# Patient Record
Sex: Female | Born: 1985 | Race: White | Hispanic: No | Marital: Single | Smoking: Current every day smoker
Health system: Southern US, Community
[De-identification: ages and names within clinical notes are randomized; demographics above are authoritative.]

## PROBLEM LIST (undated history)

## (undated) DIAGNOSIS — F1111 Opioid abuse, in remission: Secondary | ICD-10-CM

## (undated) HISTORY — PX: TUBAL LIGATION: SHX77

---
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
## 2004-05-30 ENCOUNTER — Emergency Department (HOSPITAL_COMMUNITY): Admission: EM | Admit: 2004-05-30 | Discharge: 2004-05-30 | Payer: Self-pay | Admitting: Emergency Medicine

## 2004-06-08 ENCOUNTER — Ambulatory Visit (HOSPITAL_COMMUNITY): Admission: RE | Admit: 2004-06-08 | Discharge: 2004-06-08 | Payer: Self-pay | Admitting: Otolaryngology

## 2004-06-08 ENCOUNTER — Ambulatory Visit (HOSPITAL_BASED_OUTPATIENT_CLINIC_OR_DEPARTMENT_OTHER): Admission: RE | Admit: 2004-06-08 | Discharge: 2004-06-08 | Payer: Self-pay | Admitting: Otolaryngology

## 2004-06-09 ENCOUNTER — Ambulatory Visit (HOSPITAL_COMMUNITY): Admission: RE | Admit: 2004-06-09 | Discharge: 2004-06-09 | Payer: Self-pay | Admitting: Otolaryngology

## 2004-10-04 ENCOUNTER — Inpatient Hospital Stay (HOSPITAL_COMMUNITY): Admission: AD | Admit: 2004-10-04 | Discharge: 2004-10-04 | Payer: Self-pay | Admitting: Obstetrics & Gynecology

## 2005-04-09 ENCOUNTER — Observation Stay (HOSPITAL_COMMUNITY): Admission: AD | Admit: 2005-04-09 | Discharge: 2005-04-10 | Payer: Self-pay | Admitting: Obstetrics and Gynecology

## 2005-05-18 ENCOUNTER — Inpatient Hospital Stay (HOSPITAL_COMMUNITY): Admission: AD | Admit: 2005-05-18 | Discharge: 2005-05-18 | Payer: Self-pay | Admitting: Obstetrics and Gynecology

## 2005-06-17 ENCOUNTER — Observation Stay (HOSPITAL_COMMUNITY): Admission: AD | Admit: 2005-06-17 | Discharge: 2005-06-18 | Payer: Self-pay | Admitting: Obstetrics and Gynecology

## 2005-07-04 ENCOUNTER — Inpatient Hospital Stay (HOSPITAL_COMMUNITY): Admission: AD | Admit: 2005-07-04 | Discharge: 2005-07-04 | Payer: Self-pay | Admitting: Obstetrics and Gynecology

## 2005-07-15 ENCOUNTER — Inpatient Hospital Stay (HOSPITAL_COMMUNITY): Admission: AD | Admit: 2005-07-15 | Discharge: 2005-07-17 | Payer: Self-pay | Admitting: Obstetrics and Gynecology

## 2005-07-15 ENCOUNTER — Encounter (INDEPENDENT_AMBULATORY_CARE_PROVIDER_SITE_OTHER): Payer: Self-pay | Admitting: Specialist

## 2006-10-05 ENCOUNTER — Emergency Department (HOSPITAL_COMMUNITY): Admission: EM | Admit: 2006-10-05 | Discharge: 2006-10-05 | Payer: Self-pay | Admitting: Emergency Medicine

## 2006-12-21 ENCOUNTER — Inpatient Hospital Stay (HOSPITAL_COMMUNITY): Admission: AD | Admit: 2006-12-21 | Discharge: 2006-12-21 | Payer: Self-pay | Admitting: Obstetrics

## 2007-02-20 ENCOUNTER — Inpatient Hospital Stay (HOSPITAL_COMMUNITY): Admission: AD | Admit: 2007-02-20 | Discharge: 2007-02-22 | Payer: Self-pay | Admitting: Obstetrics

## 2008-03-20 ENCOUNTER — Emergency Department (HOSPITAL_COMMUNITY): Admission: EM | Admit: 2008-03-20 | Discharge: 2008-03-20 | Payer: Self-pay | Admitting: Emergency Medicine

## 2008-08-12 ENCOUNTER — Inpatient Hospital Stay (HOSPITAL_COMMUNITY): Admission: AD | Admit: 2008-08-12 | Discharge: 2008-08-12 | Payer: Self-pay | Admitting: Obstetrics & Gynecology

## 2008-11-14 ENCOUNTER — Ambulatory Visit (HOSPITAL_COMMUNITY): Admission: RE | Admit: 2008-11-14 | Discharge: 2008-11-14 | Payer: Self-pay | Admitting: Obstetrics

## 2009-02-08 ENCOUNTER — Inpatient Hospital Stay (HOSPITAL_COMMUNITY): Admission: AD | Admit: 2009-02-08 | Discharge: 2009-02-10 | Payer: Self-pay | Admitting: Obstetrics

## 2009-02-09 ENCOUNTER — Encounter (INDEPENDENT_AMBULATORY_CARE_PROVIDER_SITE_OTHER): Payer: Self-pay | Admitting: Obstetrics

## 2009-09-08 ENCOUNTER — Emergency Department (HOSPITAL_COMMUNITY): Admission: EM | Admit: 2009-09-08 | Discharge: 2009-09-08 | Payer: Self-pay | Admitting: Emergency Medicine

## 2009-10-20 ENCOUNTER — Emergency Department (HOSPITAL_COMMUNITY): Admission: EM | Admit: 2009-10-20 | Discharge: 2009-10-20 | Payer: Self-pay | Admitting: Emergency Medicine

## 2010-03-12 ENCOUNTER — Emergency Department (HOSPITAL_COMMUNITY)
Admission: EM | Admit: 2010-03-12 | Discharge: 2010-03-12 | Disposition: A | Discharge status: Home / Self Care | Payer: Self-pay | Attending: Emergency Medicine | Admitting: Emergency Medicine

## 2010-03-12 DIAGNOSIS — Y92009 Unspecified place in unspecified non-institutional (private) residence as the place of occurrence of the external cause: Secondary | ICD-10-CM | POA: Insufficient documentation

## 2010-03-12 DIAGNOSIS — IMO0002 Reserved for concepts with insufficient information to code with codable children: Secondary | ICD-10-CM | POA: Insufficient documentation

## 2010-03-12 DIAGNOSIS — M79609 Pain in unspecified limb: Secondary | ICD-10-CM | POA: Insufficient documentation

## 2010-03-12 DIAGNOSIS — S92919A Unspecified fracture of unspecified toe(s), initial encounter for closed fracture: Secondary | ICD-10-CM | POA: Insufficient documentation

## 2010-03-12 DIAGNOSIS — M7989 Other specified soft tissue disorders: Secondary | ICD-10-CM | POA: Insufficient documentation

## 2010-03-12 DIAGNOSIS — F17200 Nicotine dependence, unspecified, uncomplicated: Secondary | ICD-10-CM | POA: Insufficient documentation

## 2010-03-12 DIAGNOSIS — S90129A Contusion of unspecified lesser toe(s) without damage to nail, initial encounter: Secondary | ICD-10-CM | POA: Insufficient documentation

## 2010-03-24 LAB — CBC
HCT: 24.5 % — ABNORMAL LOW (ref 36.0–46.0)
HCT: 32.3 % — ABNORMAL LOW (ref 36.0–46.0)
Hemoglobin: 10.9 g/dL — ABNORMAL LOW (ref 12.0–15.0)
Hemoglobin: 8.3 g/dL — ABNORMAL LOW (ref 12.0–15.0)
MCHC: 33.6 g/dL (ref 30.0–36.0)
MCHC: 33.8 g/dL (ref 30.0–36.0)
MCV: 95 fL (ref 78.0–100.0)
MCV: 95.1 fL (ref 78.0–100.0)
Platelets: 190 10*3/uL (ref 150–400)
Platelets: 226 10*3/uL (ref 150–400)
RBC: 2.59 MIL/uL — ABNORMAL LOW (ref 3.87–5.11)
RBC: 3.4 MIL/uL — ABNORMAL LOW (ref 3.87–5.11)
RDW: 14 % (ref 11.5–15.5)
RDW: 14.1 % (ref 11.5–15.5)
WBC: 12.4 10*3/uL — ABNORMAL HIGH (ref 4.0–10.5)
WBC: 13.3 10*3/uL — ABNORMAL HIGH (ref 4.0–10.5)

## 2010-03-24 LAB — RPR: RPR Ser Ql: NONREACTIVE

## 2010-04-10 LAB — WET PREP, GENITAL
Clue Cells Wet Prep HPF POC: NONE SEEN
Trich, Wet Prep: NONE SEEN
Yeast Wet Prep HPF POC: NONE SEEN

## 2010-04-10 LAB — URINALYSIS, ROUTINE W REFLEX MICROSCOPIC
Bilirubin Urine: NEGATIVE
Glucose, UA: NEGATIVE mg/dL
Hgb urine dipstick: NEGATIVE
Ketones, ur: NEGATIVE mg/dL
Nitrite: NEGATIVE
Protein, ur: NEGATIVE mg/dL
Specific Gravity, Urine: 1.015 (ref 1.005–1.030)
Urobilinogen, UA: 0.2 mg/dL (ref 0.0–1.0)
pH: 7.5 (ref 5.0–8.0)

## 2010-04-10 LAB — GC/CHLAMYDIA PROBE AMP, GENITAL
Chlamydia, DNA Probe: NEGATIVE
GC Probe Amp, Genital: NEGATIVE

## 2010-04-15 LAB — POCT I-STAT, CHEM 8
BUN: 6 mg/dL (ref 6–23)
Calcium, Ion: 1.19 mmol/L (ref 1.12–1.32)
Chloride: 105 mEq/L (ref 96–112)
Creatinine, Ser: 0.8 mg/dL (ref 0.4–1.2)
Glucose, Bld: 83 mg/dL (ref 70–99)
HCT: 37 % (ref 36.0–46.0)
Hemoglobin: 12.6 g/dL (ref 12.0–15.0)
Potassium: 3.8 mEq/L (ref 3.5–5.1)
Sodium: 141 mEq/L (ref 135–145)
TCO2: 30 mmol/L (ref 0–100)

## 2010-04-15 LAB — URINE MICROSCOPIC-ADD ON

## 2010-04-15 LAB — URINALYSIS, ROUTINE W REFLEX MICROSCOPIC
Glucose, UA: NEGATIVE mg/dL
Ketones, ur: 15 mg/dL — AB
Nitrite: POSITIVE — AB
Protein, ur: 100 mg/dL — AB
Specific Gravity, Urine: 1.022 (ref 1.005–1.030)
Urobilinogen, UA: 2 mg/dL — ABNORMAL HIGH (ref 0.0–1.0)
pH: 5.5 (ref 5.0–8.0)

## 2010-04-15 LAB — POCT PREGNANCY, URINE: Preg Test, Ur: NEGATIVE
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
## 2010-05-21 NOTE — Discharge Summary (Signed)
Katie Glover, Katie Glover           ACCOUNT NO.:  [id number]   MEDICAL RECORD NO.:  [id number]          PATIENT TYPE:  OBV   LOCATION:  9171                          FACILITY:  [facility]   PHYSICIAN:  Gerald Leitz, MD          DATE OF BIRTH:  June 24, 1985   DATE OF ADMISSION:  04/09/2005  DATE OF DISCHARGE:  04/09/2005                                 DISCHARGE SUMMARY   DIAGNOSES:  A 26 week, 3 day intrauterine pregnancy with preterm  contractions.   DISCHARGE DIAGNOSES:  A 26 week, 3 day intrauterine pregnancy with preterm  contractions.  No evidence of preterm labor.   BRIEF HOSPITAL COURSE:  The patient was seen at [facility] on April 09, 2005, complaining of contractions every 5 minutes.  She was noted to have  contractions every 2 minutes and received two doses of 25 mcg of Terbutaline  subcutaneously, continued to contract and was sent to the antenatal unit for  observation to rule out preterm labor.  She was given one dosage of 5 mg of  Terbutaline p.o. as well as IV fluids.  Contractions ceased.  There was no  change in her cervix.  Her cervix was actually closed upon examination.  She  will be discharged home in stable condition.  Activity:  Modified bed rest  for April 10, 2005.  She will resume regular activity on April 9, if no  contractions.   DISCHARGE MEDICATIONS:  1.  Terbutaline 5 mg p.o. q.4-6 h.  2.  Ferrous sulfate 325 mg one p.o. daily.   FOLLOWUP:  She will follow up with Dr. Sydnee Cabal within the next five days.  She is to call to schedule this appointment.  She was given  precautions  prior to discharge.      Gerald Leitz, MD  Electronically Signed     TC/MEDQ  D:  04/10/2005  T:  04/11/2005  Job:  [id number]

## 2010-05-21 NOTE — H&P (Signed)
Katie Glover, Katie Glover NO.:  [id number]   MEDICAL RECORD NO.:  [id number]          PATIENT TYPE:  INP   LOCATION:  9157                          FACILITY:  [facility]   PHYSICIAN:  Kizzie Furnish, M.D.  DATE OF BIRTH:  July 05, 1985   DATE OF ADMISSION:  06/17/2005  DATE OF DISCHARGE:                                HISTORY & PHYSICAL   This is a 25 year old G3, P1-0-1-1, at 15 and 5/7 weeks estimated  gestational age based on a six weeks ultrasound with EDD of July 17, 2005,  who presented to the office today for routine prenatal visit.  She is a  patient of Dr. Loreli Dollar.  During the office visit, the patient was  noted to have a questionable fetal heart arrhythmia on Doppler.  NST was  performed and this was reactive, but there was some question as to whether  there is a heart arrhythmia.  She will be admitted for 23 hour observation.  The patient was discussed with Dr. Ander Slade with [facility] Maternal Fetal Medicine Department.  We will obtain an ultrasound to  look at the heart structures for any anatomic abnormality.  Also, proceed  with a BPP and monitor overnight.   PAST MEDICAL HISTORY:  Significant for asthma.   PAST SURGICAL HISTORY:  Negative.   PAST OB HISTORY:  Vaginal delivery at 39 weeks in 2005, no complications,  the baby weighed 6 pounds 13 ounces.   PAST GYN HISTORY:  History of Gonorrhea treated in 2006.  History of  Trichomonas with this pregnancy treated in November 2006.  Trichomonas was  also present on wet prep today.   MEDICATIONS:  Prenatal vitamins, Zoloft, iron sulfate.   ALLERGIES:  No known drug allergies.   FAMILY HISTORY:  Noncontributory.  No family history of cardiac  abnormalities or disease.   SOCIAL HISTORY:  The patient is single.  Positive tobacco use.  Denies  alcohol or illicit drug use.   REVIEW OF SYSTEMS:  Vaginal discharge and irritation, otherwise, negative.   PHYSICAL EXAMINATION:   Vital signs revealed blood pressure 116/70, weight  149 1/2 pounds, fetal heart rate baseline 130s, good beat to beat  variability with positive accelerations, questionable arrhythmia on the  tracing.  Irregular contractions approximately every 10 minutes.  No  decelerations.   IMPRESSION AND PLAN:  35 5/7 weeks intrauterine pregnancy with questionable  heart arrhythmia, will admit for 23 hour observation, continue fetal  monitoring and toco, will also request an ultrasound to evaluate heart  structures as well as a biophysical profile.  If irregularity of heart rate  continues, consider consulting [facility] Maternal Fetal Medicine  specialist and pediatric cardiology.           ______________________________  Kizzie Furnish, M.D.     RMJ/MEDQ  D:  06/17/2005  T:  06/17/2005  Job:  [id number]

## 2010-05-21 NOTE — Discharge Summary (Signed)
NAMEFAIZA, Katie Glover           ACCOUNT NO.:  [id number]   MEDICAL RECORD NO.:  [id number]          PATIENT TYPE:  INP   LOCATION:  9157                          [facility]ACILITY[facility]   PHYSICIAN:  Gerald Leitz, MD          DATE OF BIRTH:  [birth year]   DATE OF ADMISSION:  06/17/2005  DATE OF DISCHARGE:  06/18/2005                                 DISCHARGE SUMMARY   INDICATIONS FOR ADMISSION:  The patient admitted for 23 hour observation for  evaluation of fetal heart arrhythmia.   HOSPITAL COURSE:  As above. The patient was noted to have a reactive NST.  While admitted, she did have occasional arrhythmia, possible PAC's. She had  evaluation of heart structures, which appeared normal on gross examination.  BPP was 8 out of 8. Positive fetal movement. The patient was discussed with  Dr. Margot Ables with [facility]. She will be called with an  appointment by their office for a fetal echocardiogram on June 20, 2005  through June 22, 2005 at some point. She will followup with Dr. Sydnee Cabal on  June 28, 2005. She was given precautions to return if fetal movements  decrease. Given instructions on kick counts.   DISCHARGE MEDICATIONS:  She was started on Flagyl due to trichomonas. She  will continue her prenatal vitamins, terbutaline, and iron sulfate.      Gerald Leitz, MD  Electronically Signed     TC/MEDQ  D:  06/18/2005  T:  06/18/2005  Job:  [id number]

## 2010-09-24 LAB — CBC
HCT: 29.2 — ABNORMAL LOW
HCT: 33.7 — ABNORMAL LOW
Hemoglobin: 10.4 — ABNORMAL LOW
Hemoglobin: 11.6 — ABNORMAL LOW
MCHC: 34.6
MCHC: 35.5
MCV: 93.6
MCV: 95.3
Platelets: 208
Platelets: 253
RBC: 3.12 — ABNORMAL LOW
RBC: 3.53 — ABNORMAL LOW
RDW: 13
RDW: 13.6
WBC: 11 — ABNORMAL HIGH
WBC: 18.5 — ABNORMAL HIGH

## 2010-09-24 LAB — RPR: RPR Ser Ql: NONREACTIVE

## 2010-10-08 LAB — URINALYSIS, ROUTINE W REFLEX MICROSCOPIC
Bilirubin Urine: NEGATIVE
Glucose, UA: NEGATIVE
Hgb urine dipstick: NEGATIVE
Ketones, ur: NEGATIVE
Nitrite: NEGATIVE
Protein, ur: NEGATIVE
Specific Gravity, Urine: 1.01
Urobilinogen, UA: 0.2
pH: 6

## 2010-10-14 LAB — URINE MICROSCOPIC-ADD ON

## 2010-10-14 LAB — COMPREHENSIVE METABOLIC PANEL
ALT: 17
AST: 20
Albumin: 3 — ABNORMAL LOW
Alkaline Phosphatase: 58
BUN: 6
CO2: 27
Calcium: 8.9
Chloride: 105
Creatinine, Ser: 0.45
GFR calc Af Amer: >60
GFR calc non Af Amer: >60
Glucose, Bld: 72
Potassium: 3.7
Sodium: 139
Total Bilirubin: 0.3
Total Protein: 6

## 2010-10-14 LAB — URINALYSIS, ROUTINE W REFLEX MICROSCOPIC
Bilirubin Urine: NEGATIVE
Glucose, UA: NEGATIVE
Hgb urine dipstick: NEGATIVE
Ketones, ur: NEGATIVE
Nitrite: NEGATIVE
Protein, ur: NEGATIVE
Specific Gravity, Urine: 1.021
Urobilinogen, UA: 0.2
pH: 7

## 2010-10-14 LAB — CBC
HCT: 30.7 — ABNORMAL LOW
Hemoglobin: 10.6 — ABNORMAL LOW
MCHC: 34.5
MCV: 92.7
Platelets: 263
RBC: 3.31 — ABNORMAL LOW
RDW: 12.6
WBC: 8.8

## 2010-10-14 LAB — DIFFERENTIAL
Basophils Absolute: 0
Basophils Relative: 0
Eosinophils Absolute: 0.1
Eosinophils Relative: 1
Lymphocytes Relative: 24
Lymphs Abs: 2.1
Monocytes Absolute: 0.4
Monocytes Relative: 5
Neutro Abs: 6.1
Neutrophils Relative %: 69

## 2010-11-17 ENCOUNTER — Encounter: Payer: Self-pay | Admitting: Emergency Medicine

## 2010-11-17 ENCOUNTER — Other Ambulatory Visit: Payer: Self-pay

## 2010-11-17 ENCOUNTER — Emergency Department (HOSPITAL_COMMUNITY)
Admission: EM | Admit: 2010-11-17 | Discharge: 2010-11-18 | Discharge status: Against Medical Advice | Payer: Self-pay | Attending: Emergency Medicine | Admitting: Emergency Medicine

## 2010-11-17 DIAGNOSIS — M545 Low back pain, unspecified: Secondary | ICD-10-CM | POA: Insufficient documentation

## 2010-11-17 DIAGNOSIS — R079 Chest pain, unspecified: Secondary | ICD-10-CM | POA: Insufficient documentation

## 2010-11-17 NOTE — ED Notes (Signed)
Pt alert, nad, c/o low back pain, onset was several days ago, chest pain intermittently, resp even unlabored, skin pwd, denies n/v, denies changes in bowel or bladder, denies bleeding or discharge

## 2010-11-18 NOTE — ED Notes (Signed)
Pt called for room placement, pt not in lobby, Charge Rn, ALP aware

## 2010-11-18 NOTE — ED Notes (Signed)
Pt leaves post triage

## 2010-11-19 ENCOUNTER — Encounter (HOSPITAL_COMMUNITY): Admission: EM | Disposition: A | Discharge status: Home / Self Care | Payer: Self-pay | Source: Ambulatory Visit

## 2010-11-19 ENCOUNTER — Encounter (HOSPITAL_COMMUNITY): Payer: Self-pay | Admitting: Certified Registered"

## 2010-11-19 ENCOUNTER — Inpatient Hospital Stay (HOSPITAL_COMMUNITY): Payer: Self-pay | Admitting: Certified Registered"

## 2010-11-19 ENCOUNTER — Encounter (HOSPITAL_COMMUNITY): Payer: Self-pay | Admitting: Emergency Medicine

## 2010-11-19 ENCOUNTER — Other Ambulatory Visit (INDEPENDENT_AMBULATORY_CARE_PROVIDER_SITE_OTHER): Payer: Self-pay | Admitting: Surgery

## 2010-11-19 ENCOUNTER — Other Ambulatory Visit: Payer: Self-pay

## 2010-11-19 ENCOUNTER — Inpatient Hospital Stay (HOSPITAL_COMMUNITY)
Admission: EM | Admit: 2010-11-19 | Discharge: 2010-11-20 | DRG: 419 | Disposition: A | Discharge status: Home / Self Care | Payer: Self-pay | Source: Ambulatory Visit | Attending: Surgery | Admitting: Surgery

## 2010-11-19 ENCOUNTER — Emergency Department (HOSPITAL_COMMUNITY): Payer: Self-pay

## 2010-11-19 DIAGNOSIS — K810 Acute cholecystitis: Secondary | ICD-10-CM | POA: Diagnosis present

## 2010-11-19 DIAGNOSIS — K8010 Calculus of gallbladder with chronic cholecystitis without obstruction: Secondary | ICD-10-CM

## 2010-11-19 DIAGNOSIS — K8000 Calculus of gallbladder with acute cholecystitis without obstruction: Principal | ICD-10-CM | POA: Diagnosis present

## 2010-11-19 DIAGNOSIS — K819 Cholecystitis, unspecified: Secondary | ICD-10-CM

## 2010-11-19 HISTORY — PX: CHOLECYSTECTOMY: SHX55

## 2010-11-19 LAB — COMPREHENSIVE METABOLIC PANEL
ALT: 11 U/L (ref 0–35)
AST: 19 U/L (ref 0–37)
Albumin: 3.7 g/dL (ref 3.5–5.2)
Alkaline Phosphatase: 86 U/L (ref 39–117)
BUN: 10 mg/dL (ref 6–23)
CO2: 28 mEq/L (ref 19–32)
Calcium: 9.2 mg/dL (ref 8.4–10.5)
Chloride: 104 mEq/L (ref 96–112)
Creatinine, Ser: 0.58 mg/dL (ref 0.50–1.10)
GFR calc Af Amer: >90 mL/min (ref >90)
GFR calc non Af Amer: >90 mL/min (ref >90)
Glucose, Bld: 91 mg/dL (ref 70–99)
Potassium: 3.4 mEq/L — ABNORMAL LOW (ref 3.5–5.1)
Sodium: 139 mEq/L (ref 135–145)
Total Bilirubin: 0.1 mg/dL — ABNORMAL LOW (ref 0.3–1.2)
Total Protein: 7.6 g/dL (ref 6.0–8.3)

## 2010-11-19 LAB — URINALYSIS, ROUTINE W REFLEX MICROSCOPIC
Bilirubin Urine: NEGATIVE
Glucose, UA: NEGATIVE mg/dL
Hgb urine dipstick: NEGATIVE
Ketones, ur: NEGATIVE mg/dL
Nitrite: NEGATIVE
Protein, ur: NEGATIVE mg/dL
Specific Gravity, Urine: 1.025 (ref 1.005–1.030)
Urobilinogen, UA: 0.2 mg/dL (ref 0.0–1.0)
pH: 5.5 (ref 5.0–8.0)

## 2010-11-19 LAB — DIFFERENTIAL
Basophils Absolute: 0 10*3/uL (ref 0.0–0.1)
Basophils Relative: 0 % (ref 0–1)
Eosinophils Absolute: 0.2 10*3/uL (ref 0.0–0.7)
Eosinophils Relative: 2 % (ref 0–5)
Lymphocytes Relative: 19 % (ref 12–46)
Lymphs Abs: 2.2 10*3/uL (ref 0.7–4.0)
Monocytes Absolute: 0.7 10*3/uL (ref 0.1–1.0)
Monocytes Relative: 6 % (ref 3–12)
Neutro Abs: 8.3 10*3/uL — ABNORMAL HIGH (ref 1.7–7.7)
Neutrophils Relative %: 73 % (ref 43–77)

## 2010-11-19 LAB — CBC
HCT: 36.7 % (ref 36.0–46.0)
HCT: 31.5 % — ABNORMAL LOW (ref 36.0–46.0)
Hemoglobin: 12.2 g/dL (ref 12.0–15.0)
Hemoglobin: 10.5 g/dL — ABNORMAL LOW (ref 12.0–15.0)
MCH: 30.1 pg (ref 26.0–34.0)
MCH: 30 pg (ref 26.0–34.0)
MCHC: 33.2 g/dL (ref 30.0–36.0)
MCHC: 33.3 g/dL (ref 30.0–36.0)
MCV: 90.6 fL (ref 78.0–100.0)
MCV: 90 fL (ref 78.0–100.0)
Platelets: 250 10*3/uL (ref 150–400)
Platelets: 212 10*3/uL (ref 150–400)
RBC: 4.05 MIL/uL (ref 3.87–5.11)
RBC: 3.5 MIL/uL — ABNORMAL LOW (ref 3.87–5.11)
RDW: 12.8 % (ref 11.5–15.5)
RDW: 12.9 % (ref 11.5–15.5)
WBC: 11.5 10*3/uL — ABNORMAL HIGH (ref 4.0–10.5)
WBC: 8.2 10*3/uL (ref 4.0–10.5)

## 2010-11-19 LAB — LIPASE, BLOOD: Lipase: 17 U/L (ref 11–59)

## 2010-11-19 LAB — URINE MICROSCOPIC-ADD ON

## 2010-11-19 LAB — CREATININE, SERUM
Creatinine, Ser: 0.55 mg/dL (ref 0.50–1.10)
GFR calc Af Amer: >90 mL/min (ref >90)
GFR calc non Af Amer: >90 mL/min (ref >90)

## 2010-11-19 LAB — PREGNANCY, URINE: Preg Test, Ur: NEGATIVE

## 2010-11-19 SURGERY — LAPAROSCOPIC CHOLECYSTECTOMY
Anesthesia: General | Site: Abdomen | Wound class: Clean Contaminated

## 2010-11-19 MED ORDER — ACETAMINOPHEN 650 MG RE SUPP: 650.0000 mg | Freq: Four times a day (QID) | RECTAL | Status: DC | PRN

## 2010-11-19 MED ORDER — GLYCOPYRROLATE 0.2 MG/ML IJ SOLN
INTRAMUSCULAR | Status: DC | PRN
  Administered 2010-11-19: .4 mg via INTRAVENOUS

## 2010-11-19 MED ORDER — BUPIVACAINE-EPINEPHRINE 0.25% -1:200000 IJ SOLN
INTRAMUSCULAR | Status: DC | PRN
  Administered 2010-11-19: 20 mL

## 2010-11-19 MED ORDER — ROCURONIUM BROMIDE 100 MG/10ML IV SOLN
INTRAVENOUS | Status: DC | PRN
  Administered 2010-11-19: 30 mg via INTRAVENOUS

## 2010-11-19 MED ORDER — PIPERACILLIN-TAZOBACTAM 3.375 G IVPB
3.3750 g | Freq: Three times a day (TID) | INTRAVENOUS | Status: DC
  Administered 2010-11-19 – 2010-11-20 (×2): 3.375 g via INTRAVENOUS
  Filled 2010-11-19 (×3): qty 50

## 2010-11-19 MED ORDER — KETOROLAC TROMETHAMINE 30 MG/ML IJ SOLN
INTRAMUSCULAR | Status: DC | PRN
  Administered 2010-11-19: 30 mg via INTRAVENOUS

## 2010-11-19 MED ORDER — HYDROMORPHONE HCL PF 1 MG/ML IJ SOLN
0.2500 mg | INTRAMUSCULAR | Status: DC | PRN
  Administered 2010-11-19 (×2): 0.25 mg via INTRAVENOUS
  Administered 2010-11-19: 0.5 mg via INTRAVENOUS

## 2010-11-19 MED ORDER — MIDAZOLAM HCL 5 MG/5ML IJ SOLN
INTRAMUSCULAR | Status: DC | PRN
  Administered 2010-11-19: 2 mg via INTRAVENOUS

## 2010-11-19 MED ORDER — ACETAMINOPHEN 325 MG PO TABS: 650.0000 mg | ORAL_TABLET | Freq: Four times a day (QID) | ORAL | Status: DC | PRN

## 2010-11-19 MED ORDER — FENTANYL CITRATE 0.05 MG/ML IJ SOLN
INTRAMUSCULAR | Status: DC | PRN
  Administered 2010-11-19 (×2): 50 ug via INTRAVENOUS
  Administered 2010-11-19: 100 ug via INTRAVENOUS
  Administered 2010-11-19: 50 ug via INTRAVENOUS

## 2010-11-19 MED ORDER — HYDROMORPHONE HCL PF 1 MG/ML IJ SOLN: 2.0000 mg | INTRAMUSCULAR | Status: DC | PRN

## 2010-11-19 MED ORDER — LACTATED RINGERS IV SOLN: INTRAVENOUS | Status: DC

## 2010-11-19 MED ORDER — PIPERACILLIN-TAZOBACTAM 3.375 G IVPB
3.3750 g | Freq: Three times a day (TID) | INTRAVENOUS | Status: DC
  Administered 2010-11-19: 3.375 g via INTRAVENOUS
  Filled 2010-11-19 (×3): qty 50

## 2010-11-19 MED ORDER — MORPHINE SULFATE 4 MG/ML IJ SOLN
4.0000 mg | Freq: Once | INTRAMUSCULAR | Status: AC
  Administered 2010-11-19: 4 mg via INTRAVENOUS
  Filled 2010-11-19: qty 1

## 2010-11-19 MED ORDER — MIDAZOLAM HCL 2 MG/2ML IJ SOLN: 0.5000 mg | Freq: Once | INTRAMUSCULAR | Status: AC | PRN

## 2010-11-19 MED ORDER — HYDROMORPHONE HCL PF 1 MG/ML IJ SOLN
1.0000 mg | INTRAMUSCULAR | Status: DC | PRN
  Administered 2010-11-19: 1 mg via INTRAVENOUS
  Administered 2010-11-19: 2 mg via INTRAVENOUS
  Administered 2010-11-19: 1 mg via INTRAVENOUS
  Administered 2010-11-20: 2 mg via INTRAVENOUS
  Filled 2010-11-19 (×2): qty 1
  Filled 2010-11-19 (×2): qty 2

## 2010-11-19 MED ORDER — SODIUM CHLORIDE 0.9 % IR SOLN
Status: DC | PRN
  Administered 2010-11-19: 1000 mL

## 2010-11-19 MED ORDER — HYDROMORPHONE HCL PF 1 MG/ML IJ SOLN
1.0000 mg | Freq: Once | INTRAMUSCULAR | Status: AC
  Administered 2010-11-19: 1 mg via INTRAVENOUS
  Filled 2010-11-19: qty 1

## 2010-11-19 MED ORDER — HYDROCODONE-ACETAMINOPHEN 5-325 MG PO TABS
1.0000 | ORAL_TABLET | ORAL | Status: DC | PRN
  Administered 2010-11-20: 2 via ORAL
  Filled 2010-11-19: qty 2

## 2010-11-19 MED ORDER — PANTOPRAZOLE SODIUM 40 MG IV SOLR
40.0000 mg | Freq: Every day | INTRAVENOUS | Status: DC
  Administered 2010-11-19: 40 mg via INTRAVENOUS
  Filled 2010-11-19 (×2): qty 40

## 2010-11-19 MED ORDER — ONDANSETRON HCL 4 MG PO TABS: 4.0000 mg | ORAL_TABLET | Freq: Four times a day (QID) | ORAL | Status: DC | PRN

## 2010-11-19 MED ORDER — ACETAMINOPHEN 325 MG PO TABS: 650.0000 mg | ORAL_TABLET | ORAL | Status: DC | PRN

## 2010-11-19 MED ORDER — PROPOFOL 10 MG/ML IV EMUL
INTRAVENOUS | Status: DC | PRN
  Administered 2010-11-19: 150 mg via INTRAVENOUS

## 2010-11-19 MED ORDER — ONDANSETRON HCL 4 MG/2ML IJ SOLN: 4.0000 mg | Freq: Four times a day (QID) | INTRAMUSCULAR | Status: DC | PRN

## 2010-11-19 MED ORDER — NEOSTIGMINE METHYLSULFATE 1 MG/ML IJ SOLN
INTRAMUSCULAR | Status: DC | PRN
  Administered 2010-11-19: 3 mg via INTRAVENOUS

## 2010-11-19 MED ORDER — ENOXAPARIN SODIUM 40 MG/0.4ML ~~LOC~~ SOLN
40.0000 mg | SUBCUTANEOUS | Status: DC
  Filled 2010-11-19: qty 0.4

## 2010-11-19 MED ORDER — ONDANSETRON HCL 4 MG/2ML IJ SOLN
4.0000 mg | Freq: Once | INTRAMUSCULAR | Status: AC
  Administered 2010-11-19: 4 mg via INTRAVENOUS
  Filled 2010-11-19: qty 2

## 2010-11-19 MED ORDER — KCL IN DEXTROSE-NACL 20-5-0.9 MEQ/L-%-% IV SOLN
INTRAVENOUS | Status: DC
  Administered 2010-11-20: 02:00:00 via INTRAVENOUS
  Filled 2010-11-19 (×2): qty 1000

## 2010-11-19 MED ORDER — MORPHINE SULFATE 4 MG/ML IJ SOLN: 4.0000 mg | INTRAMUSCULAR | Status: DC | PRN

## 2010-11-19 MED ORDER — KCL IN DEXTROSE-NACL 20-5-0.45 MEQ/L-%-% IV SOLN
INTRAVENOUS | Status: DC
  Administered 2010-11-19: 13:00:00 via INTRAVENOUS
  Filled 2010-11-19 (×5): qty 1000

## 2010-11-19 MED ORDER — LACTATED RINGERS IV SOLN
INTRAVENOUS | Status: DC | PRN
  Administered 2010-11-19 (×2): via INTRAVENOUS

## 2010-11-19 MED ORDER — PROMETHAZINE HCL 25 MG/ML IJ SOLN: 6.2500 mg | INTRAMUSCULAR | Status: DC | PRN

## 2010-11-19 MED ORDER — ONDANSETRON HCL 4 MG/2ML IJ SOLN
INTRAMUSCULAR | Status: DC | PRN
  Administered 2010-11-19: 4 mg via INTRAVENOUS

## 2010-11-19 MED ORDER — KETOROLAC TROMETHAMINE 30 MG/ML IJ SOLN
30.0000 mg | Freq: Four times a day (QID) | INTRAMUSCULAR | Status: DC
  Administered 2010-11-19 – 2010-11-20 (×2): 30 mg via INTRAVENOUS
  Filled 2010-11-19 (×6): qty 1

## 2010-11-19 MED ORDER — MEPERIDINE HCL 25 MG/ML IJ SOLN: 6.2500 mg | INTRAMUSCULAR | Status: DC | PRN

## 2010-11-19 MED ORDER — SODIUM CHLORIDE 0.9 % IV BOLUS (SEPSIS)
1000.0000 mL | Freq: Once | INTRAVENOUS | Status: AC
  Administered 2010-11-19: 1000 mL via INTRAVENOUS

## 2010-11-19 SURGICAL SUPPLY — 38 items
APL SKNCLS STERI-STRIP NONHPOA (GAUZE/BANDAGES/DRESSINGS) ×1
APPLIER CLIP 5 13 M/L LIGAMAX5 (MISCELLANEOUS) ×2
APR CLP MED LRG 5 ANG JAW (MISCELLANEOUS) ×1
BAG SPEC RTRVL LRG 6X4 10 (ENDOMECHANICALS) ×1
BANDAGE ADHESIVE 1X3 (GAUZE/BANDAGES/DRESSINGS) ×5 IMPLANT
BENZOIN TINCTURE PRP APPL 2/3 (GAUZE/BANDAGES/DRESSINGS) ×2 IMPLANT
CANISTER SUCTION 2500CC (MISCELLANEOUS) ×2 IMPLANT
CHLORAPREP W/TINT 26ML (MISCELLANEOUS) ×2 IMPLANT
CLIP APPLIE 5 13 M/L LIGAMAX5 (MISCELLANEOUS) ×1 IMPLANT
CLOSURE STERI STRIP 1/2 X4 (GAUZE/BANDAGES/DRESSINGS) ×1 IMPLANT
CLOTH BEACON ORANGE TIMEOUT ST (SAFETY) ×2 IMPLANT
COVER MAYO STAND STRL (DRAPES) IMPLANT
COVER SURGICAL LIGHT HANDLE (MISCELLANEOUS) ×2 IMPLANT
DECANTER SPIKE VIAL GLASS SM (MISCELLANEOUS) ×1 IMPLANT
DRAPE C-ARM 42X72 X-RAY (DRAPES) IMPLANT
ELECT REM PT RETURN 9FT ADLT (ELECTROSURGICAL) ×2
ELECTRODE REM PT RTRN 9FT ADLT (ELECTROSURGICAL) ×1 IMPLANT
GLOVE SURG SIGNA 7.5 PF LTX (GLOVE) ×2 IMPLANT
GOWN PREVENTION PLUS XLARGE (GOWN DISPOSABLE) ×2 IMPLANT
GOWN STRL NON-REIN LRG LVL3 (GOWN DISPOSABLE) ×6 IMPLANT
KIT BASIN OR (CUSTOM PROCEDURE TRAY) ×2 IMPLANT
KIT ROOM TURNOVER OR (KITS) ×2 IMPLANT
NS IRRIG 1000ML POUR BTL (IV SOLUTION) ×2 IMPLANT
PAD ARMBOARD 7.5X6 YLW CONV (MISCELLANEOUS) ×2 IMPLANT
POUCH SPECIMEN RETRIEVAL 10MM (ENDOMECHANICALS) ×1 IMPLANT
SCISSORS LAP 5X35 DISP (ENDOMECHANICALS) IMPLANT
SET CHOLANGIOGRAPH 5 50 .035 (SET/KITS/TRAYS/PACK) IMPLANT
SET IRRIG TUBING LAPAROSCOPIC (IRRIGATION / IRRIGATOR) ×2 IMPLANT
SLEEVE ENDOPATH XCEL 5M (ENDOMECHANICALS) ×4 IMPLANT
SPECIMEN JAR SMALL (MISCELLANEOUS) ×2 IMPLANT
SUT MON AB 4-0 PC3 18 (SUTURE) ×2 IMPLANT
SUT VICRYL 0 UR6 27IN ABS (SUTURE) ×1 IMPLANT
TOWEL OR 17X24 6PK STRL BLUE (TOWEL DISPOSABLE) ×2 IMPLANT
TOWEL OR 17X26 10 PK STRL BLUE (TOWEL DISPOSABLE) ×2 IMPLANT
TRAY LAPAROSCOPIC (CUSTOM PROCEDURE TRAY) ×2 IMPLANT
TROCAR XCEL BLUNT TIP 100MML (ENDOMECHANICALS) ×2 IMPLANT
TROCAR XCEL NON-BLD 5MMX100MML (ENDOMECHANICALS) ×2 IMPLANT
WATER STERILE IRR 1000ML POUR (IV SOLUTION) IMPLANT

## 2010-11-19 NOTE — Op Note (Signed)
Laparoscopic Cholecystectomy Procedure Note  Indications: This patient presents with symptomatic gallbladder disease and will undergo laparoscopic cholecystectomy.  Pre-operative Diagnosis: acute cholecystitis with cholelithiasis  Post-operative Diagnosis: Same  Surgeon: Abigail Miyamoto A   Assistants: 0  Anesthesia: General endotracheal anesthesia  ASA Class: 1  Procedure Details  The patient was seen again in the Holding Room. The risks, benefits, complications, treatment options, and expected outcomes were discussed with the patient. The possibilities of reaction to medication, pulmonary aspiration, perforation of viscus, bleeding, recurrent infection, finding a normal gallbladder, the need for additional procedures, failure to diagnose a condition, the possible need to convert to an open procedure, and creating a complication requiring transfusion or operation were discussed with the patient. The likelihood of improving the patient's symptoms with return to their baseline status is good.  The patient and/or family concurred with the proposed plan, giving informed consent. The site of surgery properly noted. The patient was taken to Operating Room, identified as Katie Glover and the procedure verified as Laparoscopic Cholecystectomy with Intraoperative Cholangiogram. A Time Out was held and the above information confirmed.  Prior to the induction of general anesthesia, antibiotic prophylaxis was administered. General endotracheal anesthesia was then administered and tolerated well. After the induction, the abdomen was prepped with Chloraprep and draped in sterile fashion. The patient was positioned in the supine position.  Local anesthetic agent was injected into the skin near the umbilicus and an incision made. We dissected down to the abdominal fascia with blunt dissection.  The fascia was incised vertically and we entered the peritoneal cavity bluntly.  A pursestring suture of  0-Vicryl was placed around the fascial opening.  The Hasson cannula was inserted and secured with the stay suture.  Pneumoperitoneum was then created with CO2 and tolerated well without any adverse changes in the patient's vital signs. An 11-mm port was placed in the subxiphoid position.  Two 5-mm ports were placed in the right upper quadrant. All skin incisions were infiltrated with a local anesthetic agent before making the incision and placing the trocars.   We positioned the patient in reverse Trendelenburg, tilted slightly to the patient's left.  The gallbladder was identified, the fundus grasped and retracted cephalad. Adhesions were lysed bluntly and with the electrocautery where indicated, taking care not to injure any adjacent organs or viscus. The infundibulum was grasped and retracted laterally, exposing the peritoneum overlying the triangle of Calot. This was then divided and exposed in a blunt fashion. The cystic duct was clearly identified and bluntly dissected circumferentially. A critical view of the cystic duct and cystic artery was obtained.  The cystic duct was then ligated with clips and divided. The cystic artery was, dissected free, ligated with clips and divided as well.   The gallbladder was dissected from the liver bed in retrograde fashion with the electrocautery. The gallbladder was removed and placed in an Endocatch sac. The liver bed was irrigated and inspected. Hemostasis was achieved with the electrocautery. Copious irrigation was utilized and was repeatedly aspirated until clear.  The gallbladder and Endocatch sac were then removed through the umbilical port site.  The pursestring suture was used to close the umbilical fascia.    We again inspected the right upper quadrant for hemostasis.  Pneumoperitoneum was released as we removed the trocars.  4-0 Monocryl was used to close the skin.   Benzoin, steri-strips, and clean dressings were applied. The patient was then extubated and  brought to the recovery room in stable condition. Instrument, sponge,  and needle counts were correct at closure and at the conclusion of the case.   Findings: Cholecystitis with Cholelithiasis  Estimated Blood Loss: Minimal         Drains: 0         Specimens: Gallbladder           Complications: None; patient tolerated the procedure well.         Disposition: PACU - hemodynamically stable.         Condition: stable    2

## 2010-11-19 NOTE — H&P (Signed)
I have seen and examined the patient and agree with the assessment and plans  

## 2010-11-19 NOTE — Preoperative (Signed)
Beta Blockers   Reason not to administer Beta Blockers:Pt does not take beta blockers

## 2010-11-19 NOTE — H&P (Signed)
Chief Complaint: Abdominal pain HPI: Katie Glover is an 25 y.o. female with about 4 day c/o of RUQ abd pain. She has never had this kind of [ain before. No known hx of gallstones. No other GI hx. Appetite has been ok despite some nausea. No fever. No CP, SOB, dysuria. BMs normal. Came to ER and found to have gallstones and wall thickening. LBs ok, Surgery consult called.  Past Medical History: History reviewed. No pertinent past medical history except 4 pregnancies, all vaginal delivery.  Past Surgical History:  Past Surgical History  Procedure Date  . Tubal ligation     Family History: History reviewed. No pertinent family history.  Social History:  reports that she has been smoking, 1ppd  She has never used smokeless tobacco. She reports that she uses illicit drugs (Marijuana). She reports that she does not drink alcohol.  Allergies: No Known Allergies  Medications: Medications Prior to Admission  Medication Dose Route Frequency Provider Last Rate Last Dose  . morphine 4 MG/ML injection 4 mg  4 mg Intravenous Once [employer], PA   4 mg at 11/19/10 1610  . morphine 4 MG/ML injection 4 mg  4 mg Intravenous Once [employer], PA   4 mg at 11/19/10 0754  . ondansetron (ZOFRAN) injection 4 mg  4 mg Intravenous Once Demetrius Charity, PA   4 mg at 11/19/10 9604  . sodium chloride 0.9 % bolus 1,000 mL  1,000 mL Intravenous Once Demetrius Charity, PA   1,000 mL at 11/19/10 0642  . DISCONTD: morphine 4 MG/ML injection 4 mg  4 mg Intravenous Q1H PRN Demetrius Charity, PA       No current outpatient prescriptions on file as of 11/19/2010.    See HPI for pertinent positives, otherwise 12 system negative.  Blood pressure 121/76, pulse 72, temperature 97.3 F (36.3 C), temperature source Oral, resp. rate 14, last menstrual period 11/03/2010, SpO2 100.00%. There is no height or weight on file to calculate BMI.  General Appearance:  Alert, cooperative, no distress, appears  stated age  Head:  Normocephalic, without obvious abnormality, atraumatic  ENT: Unremarkable  Neck: Supple, symmetrical, trachea midline, no adenopathy, thyroid: not enlarged, symmetric, no tenderness/mass/nodules  Lungs:   Clear to auscultation bilaterally, no w/r/r, respirations unlabored without use of accessory muscles.  Chest Wall:  No tenderness or deformity  Heart:  Regular rate and rhythm, S1, S2 normal, no murmur, rub or gallop. Carotids 2+ without bruit.  Abdomen:   Soft, non-tender, non distended. Tender RUQ, no masses  Genitalia:  Normal. No hernias  Rectal:  Deferred.  Extremities: Extremities normal, atraumatic, no cyanosis or edema  Pulses: 2+ and symmetric  Skin: Skin color, texture, turgor normal, no rashes or lesions  Neurologic: Normal affect, no gross deficits.   Results for orders placed during the hospital encounter of 11/19/10 (from the past 48 hour(s))  URINALYSIS, ROUTINE W REFLEX MICROSCOPIC     Status: Abnormal   Collection Time   11/19/10  6:35 AM      Component Value Range Comment   Color, Urine YELLOW  YELLOW     Appearance CLOUDY (*) CLEAR     Specific Gravity, Urine 1.025  1.005 - 1.030     pH 5.5  5.0 - 8.0     Glucose, UA NEGATIVE  NEGATIVE (mg/dL)    Hgb urine dipstick NEGATIVE  NEGATIVE     Bilirubin Urine NEGATIVE  NEGATIVE     Ketones, ur  NEGATIVE  NEGATIVE (mg/dL)    Protein, ur NEGATIVE  NEGATIVE (mg/dL)    Urobilinogen, UA 0.2  0.0 - 1.0 (mg/dL)    Nitrite NEGATIVE  NEGATIVE     Leukocytes, UA TRACE (*) NEGATIVE    PREGNANCY, URINE     Status: Normal   Collection Time   11/19/10  6:35 AM      Component Value Range Comment   Preg Test, Ur NEGATIVE     URINE MICROSCOPIC-ADD ON     Status: Abnormal   Collection Time   11/19/10  6:35 AM      Component Value Range Comment   Squamous Epithelial / LPF FEW (*) RARE     WBC, UA 0-2  <3 (WBC/hpf)    RBC / HPF 11-20  <3 (RBC/hpf)    Bacteria, UA FEW (*) RARE    CBC     Status: Abnormal    Collection Time   11/19/10  6:45 AM      Component Value Range Comment   WBC 11.5 (*) 4.0 - 10.5 (K/uL)    RBC 4.05  3.87 - 5.11 (MIL/uL)    Hemoglobin 12.2  12.0 - 15.0 (g/dL)    HCT 16.1  09.6 - 04.5 (%)    MCV 90.6  78.0 - 100.0 (fL)    MCH 30.1  26.0 - 34.0 (pg)    MCHC 33.2  30.0 - 36.0 (g/dL)    RDW 40.9  81.1 - 91.4 (%)    Platelets 250  150 - 400 (K/uL)   DIFFERENTIAL     Status: Abnormal   Collection Time   11/19/10  6:45 AM      Component Value Range Comment   Neutrophils Relative 73  43 - 77 (%)    Neutro Abs 8.3 (*) 1.7 - 7.7 (K/uL)    Lymphocytes Relative 19  12 - 46 (%)    Lymphs Abs 2.2  0.7 - 4.0 (K/uL)    Monocytes Relative 6  3 - 12 (%)    Monocytes Absolute 0.7  0.1 - 1.0 (K/uL)    Eosinophils Relative 2  0 - 5 (%)    Eosinophils Absolute 0.2  0.0 - 0.7 (K/uL)    Basophils Relative 0  0 - 1 (%)    Basophils Absolute 0.0  0.0 - 0.1 (K/uL)   COMPREHENSIVE METABOLIC PANEL     Status: Abnormal   Collection Time   11/19/10  6:45 AM      Component Value Range Comment   Sodium 139  135 - 145 (mEq/L)    Potassium 3.4 (*) 3.5 - 5.1 (mEq/L)    Chloride 104  96 - 112 (mEq/L)    CO2 28  19 - 32 (mEq/L)    Glucose, Bld 91  70 - 99 (mg/dL)    BUN 10  6 - 23 (mg/dL)    Creatinine, Ser 7.82  0.50 - 1.10 (mg/dL)    Calcium 9.2  8.4 - 10.5 (mg/dL)    Total Protein 7.6  6.0 - 8.3 (g/dL)    Albumin 3.7  3.5 - 5.2 (g/dL)    AST 19  0 - 37 (U/L)    ALT 11  0 - 35 (U/L)    Alkaline Phosphatase 86  39 - 117 (U/L)    Total Bilirubin 0.1 (*) 0.3 - 1.2 (mg/dL)    GFR calc non Af Amer >90  >90 (mL/min)    GFR calc Af Amer >90  >90 (mL/min)  LIPASE, BLOOD     Status: Normal   Collection Time   11/19/10  6:45 AM      Component Value Range Comment   Lipase 17  11 - 59 (U/L)    US Abdomen Complete  11/19/2010  *RADIOLOGY REPORT*  Clinical Data:  Right upper quadrant pain.  Nausea.  COMPLETE ABDOMINAL ULTRASOUND  Comparison:  CT abdomen and pelvis 03/20/2008.  Findings:   Gallbladder:  The gallbladder is filled with stones with wall a echo shadow sign present. Stones measure up to 1.3 cm in diameter. A small amount of free fluid is present in the right upper quadrant of the abdomen.  Gallbladder wall is thickened at 0.8 cm. Sonographer reports negative Murphy's sign.  Common bile duct:  Measures 0.3 cm.  Liver:  No focal lesion identified.  Within normal limits in parenchymal echogenicity.  IVC:  Appears normal.  Pancreas:  No focal abnormality seen.  Spleen:  Measures 5.9 cm and appears normal.  Right Kidney:  Measures 11.0 cm and appears normal.  Left Kidney:  Measures measures 11.4 cm.  Simple 1.4 cm cyst off the upper pole is noted.  Abdominal aorta:  No aneurysm identified.  IMPRESSION:  1.Multiple gallstones with gallbladder wall thickening and a small amount of right upper quadrant fluid compatible with cholecystitis. 2.  Small cyst left kidney.  Original Report Authenticated By: Bernadene Bell. Maricela Curet, M.D.    Assessment/Plan Principal Problem:  *Acute cholecystitis Admit for IV abx, IVF Discussed with pt need for cholecystectomy. Will try to get done. I discussed the procedure in detail.  The patient was given Agricultural engineer.  We discussed the risks and benefits of a laparoscopic cholecystectomy and possible cholangiogram including, but not limited to bleeding, infection, injury to surrounding structures such as the intestine or liver, bile leak, retained gallstones, need to convert to an open procedure, prolonged diarrhea, blood clots such as  DVT, common bile duct injury, anesthesia risks, and possible need for additional procedures.  The likelihood of improvement in symptoms and return to the patient's normal status is good. We discussed the typical post-operative recovery course.  Karrissa Parchment F 11/19/2010, 10:09 AM

## 2010-11-19 NOTE — Anesthesia Procedure Notes (Signed)
Procedure Name: Intubation Date/Time: 11/19/2010 5:04 PM Performed by: Glendora Score Pre-anesthesia Checklist: Patient identified, Emergency Drugs available, Suction available and Patient being monitored Patient Re-evaluated:Patient Re-evaluated prior to inductionOxygen Delivery Method: Circle System Utilized Preoxygenation: Pre-oxygenation with 100% oxygen Intubation Type: IV induction Ventilation: Mask ventilation without difficulty Laryngoscope Size: Miller and 2 Grade View: Grade I Tube type: Oral Tube size: 7.5 mm Number of attempts: 1 Airway Equipment and Method: stylet Placement Confirmation: ETT inserted through vocal cords under direct vision,  positive ETCO2 and breath sounds checked- equal and bilateral Secured at: 21 cm Tube secured with: Tape Dental Injury: Teeth and Oropharynx as per pre-operative assessment

## 2010-11-19 NOTE — ED Provider Notes (Signed)
History  CSN: [id number] Arrival date & time: 11/19/2010  5:50 AM   First MD Initiated Contact with Patient 11/19/10 0557      Chief Complaint  Patient presents with  . Chest Pain   HPI Patient seen and evaluated at 0557. Patient presents to the emergency room with complaint of epigastric abdominal pain that started at four days ago. Reports that the pain has been constant in nature. States that she has some nausea. Denies any vomiting. Reports that she took ibuprofen yesterday without any relief. Denies any SOB, coughing, or chest pain.    History reviewed. No pertinent past medical history.  Past Surgical History  Procedure Date  . Tubal ligation     No family history on file.  History  Substance Use Topics  . Smoking status: Current Everyday Smoker -- 1.0 packs/day  . Smokeless tobacco: Not on file  . Alcohol Use: Yes     OCCASIONAL    OB History    Grav Para Term Preterm Abortions TAB SAB Ect Mult Living                  Review of Systems  Constitutional: Negative for fever, chills, diaphoresis and appetite change.  HENT: Negative for neck pain.   Eyes: Negative for photophobia and visual disturbance.  Respiratory: Negative for cough, chest tightness and shortness of breath.   Cardiovascular: Negative for chest pain.  Gastrointestinal: Negative for nausea, vomiting and abdominal pain.  Genitourinary: Negative for flank pain.  Musculoskeletal: Negative for back pain.  Skin: Negative for rash.  Neurological: Negative for weakness and numbness.  All other systems reviewed and are negative.    Allergies  Review of patient's allergies indicates no known allergies.  Home Medications  No current outpatient prescriptions on file.  BP 111/72  Pulse 56  Temp(Src) 97.3 F (36.3 C) (Oral)  Resp 16  SpO2 100%  LMP 11/03/2010  Physical Exam  Nursing note and vitals reviewed. Constitutional: She appears well-developed and well-nourished.  HENT:  Head:  Normocephalic and atraumatic.  Eyes: EOM are normal. Pupils are equal, round, and reactive to light.  Neck: Normal range of motion.  Cardiovascular: Normal rate, regular rhythm and normal heart sounds.  Exam reveals no gallop and no friction rub.   No murmur heard. Abdominal: Bowel sounds are normal. She exhibits no shifting dullness, no distension, no pulsatile liver, no fluid wave, no abdominal bruit, no ascites, no pulsatile midline mass and no mass. There is no hepatosplenomegaly, splenomegaly or hepatomegaly. There is tenderness in the right upper quadrant and epigastric area. There is positive Murphy's sign. There is no rigidity, no rebound, no guarding, no CVA tenderness and no tenderness at McBurney's point. No hernia. Hernia confirmed negative in the ventral area.    Skin: Skin is warm and dry. No rash noted. No erythema. No pallor.  Psychiatric: She has a normal mood and affect. Her behavior is normal. Judgment and thought content normal.    ED Course  Procedures (including critical care time)   Medications  morphine 4 MG/ML injection 4 mg (not administered)  sodium chloride 0.9 % bolus 1,000 mL (1000 mL Intravenous Given 11/19/10 0642)  ondansetron (ZOFRAN) injection 4 mg (4 mg Intravenous Given 11/19/10 0642)  morphine 4 MG/ML injection 4 mg (4 mg Intravenous Given 11/19/10 0642)    7:54 AM Patient seen and re-evaluated. Resting comfortably. VSS stable. NAD. Patient notified of testing results. Stated agreement and understanding. Patient stated understanding to treatment plan and diagnosis.  Patient will be transferred to the CDU for holding, and an [place] of abdomen to r/o gallstones due to the RUQ tenderness. Patient care taken over by Rhea Bleacher, PA-C  8:06 AM D/W Dr. Karma Ganja. Patient to have an [place] of abdomen complete.    Date: 11/19/2010, 5:55  Rate: 58  Rhythm: sinus bradycardia  QRS Axis: normal  Intervals: normal  ST/T Wave abnormalities: normal and t-wave inversion on  V3, unchanged  Conduction Disutrbances:none  Narrative Interpretation:   Old EKG Reviewed: unchanged, 11/17/10  9:14 AM spoke to surgery, Caryn Bee. Who stated that they will come see the patient.   Results for orders placed during the hospital encounter of 11/19/10  CBC      Component Value Range   WBC 11.5 (*) 4.0 - 10.5 (K/uL)   RBC 4.05  3.87 - 5.11 (MIL/uL)   Hemoglobin 12.2  12.0 - 15.0 (g/dL)   HCT 40.9  81.1 - 91.4 (%)   MCV 90.6  78.0 - 100.0 (fL)   MCH 30.1  26.0 - 34.0 (pg)   MCHC 33.2  30.0 - 36.0 (g/dL)   RDW 78.2  95.6 - 21.3 (%)   Platelets 250  150 - 400 (K/uL)  DIFFERENTIAL      Component Value Range   Neutrophils Relative 73  43 - 77 (%)   Neutro Abs 8.3 (*) 1.7 - 7.7 (K/uL)   Lymphocytes Relative 19  12 - 46 (%)   Lymphs Abs 2.2  0.7 - 4.0 (K/uL)   Monocytes Relative 6  3 - 12 (%)   Monocytes Absolute 0.7  0.1 - 1.0 (K/uL)   Eosinophils Relative 2  0 - 5 (%)   Eosinophils Absolute 0.2  0.0 - 0.7 (K/uL)   Basophils Relative 0  0 - 1 (%)   Basophils Absolute 0.0  0.0 - 0.1 (K/uL)  COMPREHENSIVE METABOLIC PANEL      Component Value Range   Sodium 139  135 - 145 (mEq/L)   Potassium 3.4 (*) 3.5 - 5.1 (mEq/L)   Chloride 104  96 - 112 (mEq/L)   CO2 28  19 - 32 (mEq/L)   Glucose, Bld 91  70 - 99 (mg/dL)   BUN 10  6 - 23 (mg/dL)   Creatinine, Ser 0.86  0.50 - 1.10 (mg/dL)   Calcium 9.2  8.4 - 57.8 (mg/dL)   Total Protein 7.6  6.0 - 8.3 (g/dL)   Albumin 3.7  3.5 - 5.2 (g/dL)   AST 19  0 - 37 (U/L)   ALT 11  0 - 35 (U/L)   Alkaline Phosphatase 86  39 - 117 (U/L)   Total Bilirubin 0.1 (*) 0.3 - 1.2 (mg/dL)   GFR calc non Af Amer >90  >90 (mL/min)   GFR calc Af Amer >90  >90 (mL/min)  LIPASE, BLOOD      Component Value Range   Lipase 17  11 - 59 (U/L)  URINALYSIS, ROUTINE W REFLEX MICROSCOPIC      Component Value Range   Color, Urine YELLOW  YELLOW    Appearance CLOUDY (*) CLEAR    Specific Gravity, Urine 1.025  1.005 - 1.030    pH 5.5  5.0 - 8.0     Glucose, UA NEGATIVE  NEGATIVE (mg/dL)   Hgb urine dipstick NEGATIVE  NEGATIVE    Bilirubin Urine NEGATIVE  NEGATIVE    Ketones, ur NEGATIVE  NEGATIVE (mg/dL)   Protein, ur NEGATIVE  NEGATIVE (mg/dL)   Urobilinogen, UA 0.2  0.0 -  1.0 (mg/dL)   Nitrite NEGATIVE  NEGATIVE    Leukocytes, UA TRACE (*) NEGATIVE   PREGNANCY, URINE      Component Value Range   Preg Test, Ur NEGATIVE    URINE MICROSCOPIC-ADD ON      Component Value Range   Squamous Epithelial / LPF FEW (*) RARE    WBC, UA 0-2  <3 (WBC/hpf)   RBC / HPF 11-20  <3 (RBC/hpf)   Bacteria, UA FEW (*) RARE    US Abdomen Complete  11/19/2010  *RADIOLOGY REPORT*  Clinical Data:  Right upper quadrant pain.  Nausea.  COMPLETE ABDOMINAL ULTRASOUND  Comparison:  CT abdomen and pelvis 03/20/2008.  Findings:  Gallbladder:  The gallbladder is filled with stones with wall a echo shadow sign present. Stones measure up to 1.3 cm in diameter. A small amount of free fluid is present in the right upper quadrant of the abdomen.  Gallbladder wall is thickened at 0.8 cm. Sonographer reports negative Murphy's sign.  Common bile duct:  Measures 0.3 cm.  Liver:  No focal lesion identified.  Within normal limits in parenchymal echogenicity.  IVC:  Appears normal.  Pancreas:  No focal abnormality seen.  Spleen:  Measures 5.9 cm and appears normal.  Right Kidney:  Measures 11.0 cm and appears normal.  Left Kidney:  Measures measures 11.4 cm.  Simple 1.4 cm cyst off the upper pole is noted.  Abdominal aorta:  No aneurysm identified.  IMPRESSION:  1.Multiple gallstones with gallbladder wall thickening and a small amount of right upper quadrant fluid compatible with cholecystitis. 2.  Small cyst left kidney.  Original Report Authenticated By: Bernadene Bell. Maricela Curet, M.D.       MDM  Cholelithiasis Cholecystitis   Demetrius Charity, PA 11/19/10 8295  Demetrius Charity, PA 11/19/10 [phone]

## 2010-11-19 NOTE — Anesthesia Postprocedure Evaluation (Signed)
  Anesthesia Post-op Note  Patient: Katie Glover  Procedure(s) Performed:  LAPAROSCOPIC CHOLECYSTECTOMY  Patient Location: PACU  Anesthesia Type: General  Level of Consciousness: awake  Airway and Oxygen Therapy: Patient Spontanous Breathing  Post-op Pain: mild  Post-op Assessment: Post-op Vital signs reviewed  Post-op Vital Signs: stable  Complications: No apparent anesthesia complications

## 2010-11-19 NOTE — Transfer of Care (Signed)
Immediate Anesthesia Transfer of Care Note  Patient: Katie Glover  Procedure(s) Performed:  LAPAROSCOPIC CHOLECYSTECTOMY  Patient Location: PACU  Anesthesia Type: General  Level of Consciousness: awake, alert , oriented and patient cooperative  Airway & Oxygen Therapy: Patient Spontanous Breathing  Post-op Assessment: Report given to PACU RN  Post vital signs: Reviewed and stable  Complications: No apparent anesthesia complications

## 2010-11-19 NOTE — Interval H&P Note (Signed)
History and Physical Interval Note:   11/19/2010   2:56 PM   Katie Glover  has presented today for surgery, with the diagnosis of * No pre-op diagnosis entered *  The various methods of treatment have been discussed with the patient and family. After consideration of risks, benefits and other options for treatment, the patient has consented to  Procedure(s): LAPAROSCOPIC CHOLECYSTECTOMY as a surgical intervention .  The patients' history has been reviewed, patient examined, no change in status, stable for surgery.  I have reviewed the patients' chart and labs.  Questions were answered to the patient's satisfaction.     Abigail Miyamoto A  MD

## 2010-11-19 NOTE — Anesthesia Preprocedure Evaluation (Addendum)
Anesthesia Evaluation  Patient identified by MRN, date of birth, ID band Patient awake    Reviewed: Allergy & Precautions, H&P , NPO status , Patient's Chart, lab work & pertinent test results, reviewed documented beta blocker date and time   Airway Mallampati: I TM Distance: >3 FB Neck ROM: Full    Dental  (+) Dental Advisory Given   Pulmonary Current Smoker (1ppd x 63yrs; + marijuana use),          Cardiovascular neg cardio ROS     Neuro/Psych Negative Neurological ROS  Negative Psych ROS   GI/Hepatic negative GI ROS, Neg liver ROS,   Endo/Other  Negative Endocrine ROS  Renal/GU negative Renal ROS  Genitourinary negative   Musculoskeletal negative musculoskeletal ROS (+)   Abdominal   Peds negative pediatric ROS (+)  Hematology negative hematology ROS (+)   Anesthesia Other Findings   Reproductive/Obstetrics negative OB ROS                         Anesthesia Physical Anesthesia Plan  ASA: II  Anesthesia Plan: General   Post-op Pain Management:    Induction: Intravenous  Airway Management Planned: Oral ETT  Additional Equipment:   Intra-op Plan:   Post-operative Plan:   Informed Consent: I have reviewed the patients History and Physical, chart, labs and discussed the procedure including the risks, benefits and alternatives for the proposed anesthesia with the patient or authorized representative who has indicated his/her understanding and acceptance.   Dental advisory given  Plan Discussed with: CRNA, Anesthesiologist and Surgeon  Anesthesia Plan Comments:         Anesthesia Quick Evaluation

## 2010-11-19 NOTE — ED Notes (Signed)
PT. REPORTS PERSISTENT MIDSTERNAL CHEST PAIN X 4 DAYS WITH SOB , DENIES COUGH , NAUSEA , NO VOMITTING OR DIAPHORESIS.  NO MEDS PRIOR TO ARRIVAL.

## 2010-11-20 MED ORDER — HYDROCODONE-ACETAMINOPHEN 5-325 MG PO TABS: 1.0000 | ORAL_TABLET | ORAL | Status: AC | PRN

## 2010-11-20 NOTE — Discharge Summary (Signed)
Physician Discharge Summary  Patient ID: Katie Glover MRN: [id number] DOB/AGE: 28-Oct-1985 25 y.o.  Admit date: 11/19/2010 Discharge date: 11/20/2010  Admission Diagnoses:  Discharge Diagnoses:  Principal Problem:  *Acute cholecystitis   Discharged Condition: good  Hospital Course: had lap chole by Dr. Magnus Ivan   Consults: none  Significant Diagnostic Studies: none  Treatments: surgery: lap cholecystectomy   Discharge Exam: Blood pressure 100/68, pulse 47, temperature 98.4 F (36.9 C), temperature source Oral, resp. rate 16, height 5\' 2"  (1.575 m), weight 117 lb (53.071 kg), last menstrual period 11/03/2010, SpO2 99.00%. dressings in place  Disposition: Home  Discharge Orders    Future Orders Please Complete By Expires   Diet - low sodium heart healthy      Increase activity slowly      Discharge instructions      Comments:   followup with Dr Carman Ching in 3 weeks [phone]   Discharge wound care:      Comments:   May shower when home.     Current Discharge Medication List    START taking these medications   Details  HYDROcodone-acetaminophen (NORCO) 5-325 MG per tablet Take 1-2 tablets by mouth every 4 (four) hours as needed for pain. Qty: 50 tablet, Refills: 1       Follow-up Information    Follow up with CCS,MD on 12/07/2010. (1:30pm [facility] clinic)    Contact information:   [address] [place] [address] [phone]          Signed: Valarie Merino 11/20/2010, 8:34 AM

## 2010-11-20 NOTE — Progress Notes (Signed)
  1 Day Post-Op  1 Day Post-Op  Subjective: Patient lying in bed with boyfriend.  No complaints of pain and patient indicates that she is ready to go home.    Objective: Vital signs in last 24 hours: Temp:  [97.7 F (36.5 C)-98.6 F (37 C)] 98.4 F (36.9 C) (11/17 0545) Pulse Rate:  [47-128] 47  (11/17 0545) Resp:  [8-44] 16  (11/17 0545) BP: (96-121)/(58-76) 100/68 mmHg (11/17 0545) SpO2:  [81 %-100 %] 99 % (11/17 0545) Weight:  [117 lb (53.071 kg)] 117 lb (53.071 kg) (11/16 2020)   Intake/Output from previous day: 11/16 0701 - 11/17 0700 In: 2921 [I.V.:2871; IV Piggyback:50] Out: 1000 [Urine:1000] Intake/Output this shift:    dressings in place  Lab Results:   [facility] 11/19/10 2016 11/19/10 0645  WBC 8.2 11.5*  HGB 10.5* 12.2  HCT 31.5* 36.7  PLT 212 250   BMET  Basename 11/19/10 2016 11/19/10 0645  NA -- 139  K -- 3.4*  CL -- 104  CO2 -- 28  GLUCOSE -- 91  BUN -- 10  CREATININE 0.55 0.58  CALCIUM -- 9.2   PT/INR No results found for this basename: LABPROT:2,INR:2 in the last 72 hours  Studies/Results: US Abdomen Complete  11/19/2010  *RADIOLOGY REPORT*  Clinical Data:  Right upper quadrant pain.  Nausea.  COMPLETE ABDOMINAL ULTRASOUND  Comparison:  CT abdomen and pelvis 03/20/2008.  Findings:  Gallbladder:  The gallbladder is filled with stones with wall a echo shadow sign present. Stones measure up to 1.3 cm in diameter. A small amount of free fluid is present in the right upper quadrant of the abdomen.  Gallbladder wall is thickened at 0.8 cm. Sonographer reports negative Murphy's sign.  Common bile duct:  Measures 0.3 cm.  Liver:  No focal lesion identified.  Within normal limits in parenchymal echogenicity.  IVC:  Appears normal.  Pancreas:  No focal abnormality seen.  Spleen:  Measures 5.9 cm and appears normal.  Right Kidney:  Measures 11.0 cm and appears normal.  Left Kidney:  Measures measures 11.4 cm.  Simple 1.4 cm cyst off the upper pole is noted.   Abdominal aorta:  No aneurysm identified.  IMPRESSION:  1.Multiple gallstones with gallbladder wall thickening and a small amount of right upper quadrant fluid compatible with cholecystitis. 2.  Small cyst left kidney.  Original Report Authenticated By: Bernadene Bell. Maricela Curet, M.D.    Anti-infectives: Anti-infectives     Start     Dose/Rate Route Frequency Ordered Stop   11/19/10 2100  piperacillin-tazobactam (ZOSYN) IVPB 3.375 g       3.375 g 12.5 mL/hr over 240 Minutes Intravenous Every 8 hours 11/19/10 2005     11/19/10 1400   piperacillin-tazobactam (ZOSYN) IVPB 3.375 g  Status:  Discontinued        3.375 g 12.5 mL/hr over 240 Minutes Intravenous 3 times per day 11/19/10 1229 11/20/10 0521          Assessment/Plan: Discharge 1 Day Post-Op    LOS: 1 day    Matt B. Daphine Deutscher, MD, [facility] [phone] beeper [phone]  11/20/2010 8:28 AM

## 2010-11-20 NOTE — ED Provider Notes (Signed)
Medical screening examination/treatment/procedure(s) were performed by non-physician practitioner and as supervising physician I was immediately available for consultation/collaboration.   Crista Nuon A. Patrica Duel, MD 11/20/10 [phone]

## 2010-11-23 ENCOUNTER — Encounter (HOSPITAL_COMMUNITY): Payer: Self-pay | Admitting: Surgery

## 2010-12-07 ENCOUNTER — Ambulatory Visit (INDEPENDENT_AMBULATORY_CARE_PROVIDER_SITE_OTHER): Payer: Self-pay | Admitting: General Surgery

## 2010-12-07 ENCOUNTER — Encounter (INDEPENDENT_AMBULATORY_CARE_PROVIDER_SITE_OTHER): Payer: Self-pay

## 2010-12-07 VITALS — BP 110/66 | Resp 14 | Ht 62.0 in | Wt 116.0 lb

## 2010-12-07 DIAGNOSIS — K8010 Calculus of gallbladder with chronic cholecystitis without obstruction: Secondary | ICD-10-CM

## 2010-12-07 NOTE — Patient Instructions (Signed)
Resume normal  Activities as before. Call if you have any problems.

## 2010-12-07 NOTE — Progress Notes (Signed)
Patient ID: Katie Glover, female   DOB: 1985-11-09, 25 y.o.   MRN: [id number] Katie Glover 09-19-1985 [id number] 12/07/2010   Katie Glover is a 25 y.o. female who had a laparoscopic cholecystectomy with intraoperative cholangiogram.  The pathology report confirmed cholelithiasis, andcholecystitis.  The patient reports that they are feeling well with normal bowel movements and good appetite.  The pre-operative symptoms of abdominal pain, nausea, and vomiting have resolved.    Physical examination - Incisions appear well-healed with no sign of infection or bleeding.   Abdomen - soft, non-tender  Impression:  s/p laparoscopic cholecystectomy  Plan:  She may resume a regular diet and full activity.  She may follow-up on a PRN basis.

## 2011-05-16 ENCOUNTER — Emergency Department (HOSPITAL_COMMUNITY)
Admission: EM | Admit: 2011-05-16 | Discharge: 2011-05-16 | Disposition: A | Discharge status: Home / Self Care | Payer: Self-pay | Attending: Emergency Medicine | Admitting: Emergency Medicine

## 2011-05-16 ENCOUNTER — Emergency Department (HOSPITAL_COMMUNITY): Payer: Self-pay

## 2011-05-16 ENCOUNTER — Encounter (HOSPITAL_COMMUNITY): Payer: Self-pay | Admitting: Emergency Medicine

## 2011-05-16 DIAGNOSIS — F17200 Nicotine dependence, unspecified, uncomplicated: Secondary | ICD-10-CM | POA: Insufficient documentation

## 2011-05-16 DIAGNOSIS — R05 Cough: Secondary | ICD-10-CM | POA: Insufficient documentation

## 2011-05-16 DIAGNOSIS — J40 Bronchitis, not specified as acute or chronic: Secondary | ICD-10-CM | POA: Insufficient documentation

## 2011-05-16 DIAGNOSIS — R059 Cough, unspecified: Secondary | ICD-10-CM | POA: Insufficient documentation

## 2011-05-16 MED ORDER — ALBUTEROL SULFATE HFA 108 (90 BASE) MCG/ACT IN AERS: 1.0000 | INHALATION_SPRAY | Freq: Four times a day (QID) | RESPIRATORY_TRACT | Status: DC | PRN

## 2011-05-16 MED ORDER — AZITHROMYCIN 250 MG PO TABS: ORAL_TABLET | ORAL | Status: AC

## 2011-05-16 NOTE — ED Provider Notes (Signed)
History     CSN: [id number]  Arrival date & time 05/16/11  1830   First MD Initiated Contact with Patient 05/16/11 2131      Chief Complaint  Patient presents with  . Cough    Patient is a 26 y.o. female presenting with cough. The history is provided by the patient.  Cough This is a new problem. The current episode started more than 1 week ago (ongoing for the last month). The cough is non-productive. There has been no fever. Pertinent negatives include no chest pain, no chills, no ear congestion, no rhinorrhea, no shortness of breath and no wheezing. She is a smoker. Her past medical history is significant for bronchitis. Her past medical history does not include pneumonia.    History reviewed. No pertinent past medical history.  Past Surgical History  Procedure Date  . Tubal ligation   . Cholecystectomy 11/19/2010    Procedure: LAPAROSCOPIC CHOLECYSTECTOMY;  Surgeon: Shelly Rubenstein, MD;  Location: [facility] OR;  Service: General;  Laterality: N/A;    Family History  Problem Relation Age of Onset  . Kidney cancer    . Lung cancer      History  Substance Use Topics  . Smoking status: Current Everyday Smoker -- 1.0 packs/day for 9 years    Types: Cigarettes  . Smokeless tobacco: Never Used  . Alcohol Use: No     OCCASIONAL    OB History    Grav Para Term Preterm Abortions TAB SAB Ect Mult Living                  Review of Systems  Constitutional: Negative for chills.  HENT: Negative for rhinorrhea.   Respiratory: Positive for cough. Negative for shortness of breath and wheezing.   Cardiovascular: Negative for chest pain.  All other systems reviewed and are negative.    Allergies  Review of patient's allergies indicates no known allergies.  Home Medications  No current outpatient prescriptions on file.  BP 97/57  Pulse 65  Temp(Src) 98.4 F (36.9 C) (Oral)  Resp 14  SpO2 98%  LMP 05/09/2011  Physical Exam  Nursing note and vitals  reviewed. Constitutional: She appears well-developed and well-nourished. No distress.  HENT:  Head: Normocephalic and atraumatic.  Right Ear: External ear normal.  Left Ear: External ear normal.  Eyes: Conjunctivae are normal. Right eye exhibits no discharge. Left eye exhibits no discharge. No scleral icterus.  Neck: Neck supple. No tracheal deviation present.  Cardiovascular: Normal rate, regular rhythm and intact distal pulses.   Pulmonary/Chest: Effort normal and breath sounds normal. No stridor. No respiratory distress. She has no wheezes. She has no rales.  Abdominal: Soft. Bowel sounds are normal. She exhibits no distension. There is no tenderness. There is no rebound and no guarding.  Musculoskeletal: She exhibits no edema and no tenderness.  Neurological: She is alert. She has normal strength. No sensory deficit. Cranial nerve deficit:  no gross defecits noted. She exhibits normal muscle tone. She displays no seizure activity. Coordination normal.  Skin: Skin is warm and dry. No rash noted.  Psychiatric: She has a normal mood and affect.    ED Course  Procedures (including critical care time)  Labs Reviewed - No data to display Dg Chest 2 View  05/16/2011  *RADIOLOGY REPORT*  Clinical Data: Cough.  Upper respiratory infection.  Tobacco use.  CHEST - 2 VIEW  Comparison: 03/20/2008; report from 06/06/2002  Findings: Cardiac and mediastinal contours appear normal.  The lungs  appear clear.  No pleural effusion is identified.  IMPRESSION:  No significant abnormality identified.  Original Report Authenticated By: Dellia Cloud, M.D.     MDM  Patient has been having a persistent cough for the last month. There is no wheezing on exam. I counseled her on smoking cessation. Considering the chronicity of the cough I will start her on a Z-Pak to cover for the possibility of pertussis.        Celene Kras, MD 05/16/11 2150

## 2011-05-16 NOTE — Discharge Instructions (Signed)
Bronchitis Bronchitis is a problem of the air tubes leading to your lungs. This problem makes it hard for air to get in and out of the lungs. You may cough a lot because your air tubes are narrow. Going without care can cause lasting (chronic) bronchitis. HOME CARE   Drink enough fluids to keep your pee (urine) clear or pale yellow.   Use a cool mist humidifier.   Quit smoking if you smoke. If you keep smoking, the bronchitis might not get better.   Only take medicine as told by your doctor.  GET HELP RIGHT AWAY IF:   Coughing keeps you awake.   You start to wheeze.   You become more sick or weak.   You have a hard time breathing or get short of breath.   You cough up blood.   Coughing lasts more than 2 weeks.   You have a fever.   Your baby is older than 3 months with a rectal temperature of 102 F (38.9 C) or higher.   Your baby is 3 months old or younger with a rectal temperature of 100.4 F (38 C) or higher.  MAKE SURE YOU:  Understand these instructions.   Will watch your condition.   Will get help right away if you are not doing well or get worse.  Document Released: 06/08/2007 Document Revised: 12/09/2010 Document Reviewed: 11/21/2008 Exit[employer] Patient Information 2012 [employer]. 

## 2011-05-16 NOTE — ED Notes (Signed)
Patient is AOx4 and comfortable with her discharge instructions. 

## 2011-05-16 NOTE — ED Notes (Signed)
PT. REPORTS PERSISTENT PRODUCTIVE COUGH FOR SEVERAL WEEKS , DENIES FEVER OR CHILLS , SLIGHT SOB .

## 2012-01-17 ENCOUNTER — Encounter (HOSPITAL_COMMUNITY): Payer: Self-pay | Admitting: *Deleted

## 2012-01-17 ENCOUNTER — Emergency Department (HOSPITAL_COMMUNITY)
Admission: EM | Admit: 2012-01-17 | Discharge: 2012-01-17 | Disposition: A | Discharge status: Home / Self Care | Payer: Self-pay | Attending: Emergency Medicine | Admitting: Emergency Medicine

## 2012-01-17 DIAGNOSIS — F17200 Nicotine dependence, unspecified, uncomplicated: Secondary | ICD-10-CM | POA: Insufficient documentation

## 2012-01-17 DIAGNOSIS — Z79899 Other long term (current) drug therapy: Secondary | ICD-10-CM | POA: Insufficient documentation

## 2012-01-17 DIAGNOSIS — K0889 Other specified disorders of teeth and supporting structures: Secondary | ICD-10-CM

## 2012-01-17 DIAGNOSIS — K089 Disorder of teeth and supporting structures, unspecified: Secondary | ICD-10-CM | POA: Insufficient documentation

## 2012-01-17 DIAGNOSIS — K029 Dental caries, unspecified: Secondary | ICD-10-CM | POA: Insufficient documentation

## 2012-01-17 MED ORDER — HYDROCODONE-ACETAMINOPHEN 5-325 MG PO TABS: 1.0000 | ORAL_TABLET | Freq: Four times a day (QID) | ORAL | Status: DC | PRN

## 2012-01-17 MED ORDER — HYDROCODONE-ACETAMINOPHEN 5-325 MG PO TABS
1.0000 | ORAL_TABLET | Freq: Once | ORAL | Status: AC
  Administered 2012-01-17: 1 via ORAL
  Filled 2012-01-17: qty 1

## 2012-01-17 MED ORDER — PENICILLIN V POTASSIUM 500 MG PO TABS: 500.0000 mg | ORAL_TABLET | Freq: Four times a day (QID) | ORAL | Status: DC

## 2012-01-17 NOTE — ED Notes (Signed)
C/O left lower molar pain x 1 week. Has had problems with this tooth in the past but did not go to the dentist.

## 2012-01-17 NOTE — ED Notes (Signed)
Pt is here with Left lower teeth pain times 2.

## 2012-01-17 NOTE — ED Provider Notes (Signed)
Medical screening examination/treatment/procedure(s) were performed by non-physician practitioner and as supervising physician I was immediately available for consultation/collaboration.   Celene Kras, MD 01/17/12 [phone]

## 2012-01-17 NOTE — ED Provider Notes (Signed)
History     CSN: [id number]  Arrival date & time 01/17/12  1333   First MD Initiated Contact with Patient 01/17/12 1359      Chief Complaint  Patient presents with  . Dental Pain    (Consider location/radiation/quality/duration/timing/severity/associated sxs/prior treatment) HPI Patient presents to the emergency department with dental pain that began 2 days, ago.  Patient states that her left lower back molar is painful and irritated.  Patient denies nausea, vomiting, neck pain, neck swelling, facial swelling.  Chest pain, shortness of breath, difficulty swallowing, or fever.  Patient states she did not take anything prior to arrival for her discomfort. History reviewed. No pertinent past medical history.  Past Surgical History  Procedure Date  . Tubal ligation   . Cholecystectomy 11/19/2010    Procedure: LAPAROSCOPIC CHOLECYSTECTOMY;  Surgeon: Shelly Rubenstein, MD;  Location: [facility] OR;  Service: General;  Laterality: N/A;    Family History  Problem Relation Age of Onset  . Kidney cancer    . Lung cancer      History  Substance Use Topics  . Smoking status: Current Every Day Smoker -- 1.0 packs/day for 9 years    Types: Cigarettes  . Smokeless tobacco: Never Used  . Alcohol Use: No     Comment: OCCASIONAL    OB History    Grav Para Term Preterm Abortions TAB SAB Ect Mult Living                  Review of Systems All other systems negative except as documented in the HPI. All pertinent positives and negatives as reviewed in the HPI. Allergies  Review of patient's allergies indicates no known allergies.  Home Medications   Current Outpatient Rx  Name  Route  Sig  Dispense  Refill  . ALBUTEROL SULFATE HFA 108 (90 BASE) MCG/ACT IN AERS   Inhalation   Inhale 1-2 puffs into the lungs every 6 (six) hours as needed for wheezing.   1 Inhaler   0     BP 117/76  Pulse 87  Temp 97.5 F (36.4 C) (Oral)  Resp 16  SpO2 98%  Physical Exam  Constitutional: She is  oriented to person, place, and time. She appears well-developed and well-nourished.  HENT:  Head: Normocephalic and atraumatic.       Widespread dental decay with irritation around the gumline of the second molar on the lower left.  Eyes: Pupils are equal, round, and reactive to light.  Neck: Normal range of motion. Neck supple.  Pulmonary/Chest: Effort normal.  Neurological: She is alert and oriented to person, place, and time.  Skin: Skin is warm and dry. No rash noted.    ED Course  Procedures (including critical care time)  \Patient is referred to dentistry and told to return here as needed.  Told to rinse with warm water, peroxide 3 times a day  MDM          Carlyle Dolly, PA-C 01/17/12 1417

## 2012-02-15 ENCOUNTER — Emergency Department (HOSPITAL_COMMUNITY)
Admission: EM | Admit: 2012-02-15 | Discharge: 2012-02-15 | Disposition: A | Discharge status: Home / Self Care | Payer: Self-pay | Attending: Emergency Medicine | Admitting: Emergency Medicine

## 2012-02-15 ENCOUNTER — Encounter (HOSPITAL_COMMUNITY): Payer: Self-pay

## 2012-02-15 DIAGNOSIS — Z79899 Other long term (current) drug therapy: Secondary | ICD-10-CM | POA: Insufficient documentation

## 2012-02-15 DIAGNOSIS — R1310 Dysphagia, unspecified: Secondary | ICD-10-CM | POA: Insufficient documentation

## 2012-02-15 DIAGNOSIS — R0602 Shortness of breath: Secondary | ICD-10-CM | POA: Insufficient documentation

## 2012-02-15 DIAGNOSIS — F17200 Nicotine dependence, unspecified, uncomplicated: Secondary | ICD-10-CM | POA: Insufficient documentation

## 2012-02-15 DIAGNOSIS — J039 Acute tonsillitis, unspecified: Secondary | ICD-10-CM | POA: Insufficient documentation

## 2012-02-15 LAB — RAPID STREP SCREEN (MED CTR MEBANE ONLY): Streptococcus, Group A Screen (Direct): NEGATIVE

## 2012-02-15 MED ORDER — HYDROCODONE-ACETAMINOPHEN 7.5-500 MG/15ML PO SOLN: 15.0000 mL | Freq: Four times a day (QID) | ORAL | Status: DC | PRN

## 2012-02-15 MED ORDER — DEXAMETHASONE SODIUM PHOSPHATE 10 MG/ML IJ SOLN
10.0000 mg | Freq: Once | INTRAMUSCULAR | Status: AC
  Administered 2012-02-15: 10 mg via INTRAMUSCULAR
  Filled 2012-02-15: qty 1

## 2012-02-15 NOTE — ED Provider Notes (Signed)
Medical screening examination/treatment/procedure(s) were performed by non-physician practitioner and as supervising physician I was immediately available for consultation/collaboration.   Richardean Canal, MD 02/15/12 2135

## 2012-02-15 NOTE — ED Notes (Signed)
Pt complains of sore throat, started yesterday and worse today, raspy voice.

## 2012-02-15 NOTE — ED Provider Notes (Signed)
History     CSN: [id number]  Arrival date & time 02/15/12  0919   First MD Initiated Contact with Patient 02/15/12 [phone]      Chief Complaint  Patient presents with  . Sore Throat    (Consider location/radiation/quality/duration/timing/severity/associated sxs/prior treatment) HPI Comments: Katie Glover is a 27 y.o. Female who presents to the ER with c/o sore throat and hoarseness that started yesterday morning when she awoke.  She says that she had no other associated symptoms and that the sore throat and hoarseness started without inciting event.  She says that when she awoke this morning that the  Pain and inability to speak normally were even worse, which made her decide to come to the ER for treatment.  She describes her throat pain as "deep" and "constant" in nature and a 9 or 10/10 on the pain scale.  She has tried Catering manager, dayquil, and "throat spray" for the pain without any relief.  She says that swallowing, eating, and drinking make the pain worse and because of this has been unable to eat or drink since yesterday.  She denies any cough, nasal congestion, or other URI symptoms.  She has not had a fever.  She has no allergies and is a daily smoker.    Patient is a 27 y.o. female presenting with pharyngitis. The history is provided by the patient.  Sore Throat This is a new problem. The current episode started yesterday. The problem occurs constantly. The problem has been rapidly worsening. Associated symptoms include a sore throat. Pertinent negatives include no abdominal pain, anorexia, arthralgias, change in bowel habit, chest pain, chills, congestion, coughing, diaphoresis, fatigue, fever, headaches, joint swelling, myalgias, nausea, neck pain, numbness, rash, swollen glands, urinary symptoms, vertigo, visual change, vomiting or weakness. The symptoms are aggravated by drinking, eating and swallowing. She has tried acetaminophen for the symptoms. The treatment provided no  relief.    No past medical history on file.  Past Surgical History  Procedure Laterality Date  . Tubal ligation    . Cholecystectomy  11/19/2010    Procedure: LAPAROSCOPIC CHOLECYSTECTOMY;  Surgeon: Shelly Rubenstein, MD;  Location: [facility] OR;  Service: General;  Laterality: N/A;    Family History  Problem Relation Age of Onset  . Kidney cancer    . Lung cancer      History  Substance Use Topics  . Smoking status: Current Every Day Smoker -- 1.00 packs/day for 9 years    Types: Cigarettes  . Smokeless tobacco: Never Used  . Alcohol Use: No     Comment: OCCASIONAL    OB History   Grav Para Term Preterm Abortions TAB SAB Ect Mult Living                  Review of Systems  Constitutional: Negative for fever, chills, diaphoresis and fatigue.  HENT: Positive for sore throat, trouble swallowing and voice change. Negative for ear pain, congestion, facial swelling, rhinorrhea, drooling, mouth sores, neck pain, dental problem and sinus pressure.   Eyes: Negative.   Respiratory: Positive for shortness of breath. Negative for cough, wheezing and stridor.        Pt has subjective shortness of breath due to throat swelling.  Cardiovascular: Negative.  Negative for chest pain.  Gastrointestinal: Negative.  Negative for nausea, vomiting, abdominal pain, anorexia and change in bowel habit.  Endocrine: Negative.   Genitourinary: Negative.   Musculoskeletal: Negative.  Negative for myalgias, joint swelling and arthralgias.  Skin: Negative.  Negative for rash.  Allergic/Immunologic: Negative.   Neurological: Negative.  Negative for vertigo, weakness, numbness and headaches.  Hematological: Negative.   Psychiatric/Behavioral: Negative.   All other systems reviewed and are negative.    Allergies  Review of patient's allergies indicates no known allergies.  Home Medications   Current Outpatient Rx  Name  Route  Sig  Dispense  Refill  . albuterol (PROVENTIL HFA;VENTOLIN HFA) 108 (90  BASE) MCG/ACT inhaler   Inhalation   Inhale 1-2 puffs into the lungs every 6 (six) hours as needed for wheezing.   1 Inhaler   0   . HYDROcodone-acetaminophen (NORCO/VICODIN) 5-325 MG per tablet   Oral   Take 1 tablet by mouth every 6 (six) hours as needed for pain.   15 tablet   0   . ibuprofen (ADVIL,MOTRIN) 200 MG tablet   Oral   Take 400 mg by mouth every 6 (six) hours as needed. For pain         . penicillin v potassium (VEETID) 500 MG tablet   Oral   Take 1 tablet (500 mg total) by mouth 4 (four) times daily.   28 tablet   0     BP 101/62  Pulse 80  Temp(Src) 97.6 F (36.4 C) (Oral)  SpO2 99%  Physical Exam  Vitals reviewed. Constitutional: She is oriented to person, place, and time. She appears well-developed and well-nourished. No distress.  HENT:  Head: Normocephalic. No trismus in the jaw.  Mouth/Throat: Uvula is midline. Normal dentition. Oropharyngeal exudate, posterior oropharyngeal edema and posterior oropharyngeal erythema present. No tonsillar abscesses.  Tonsils enlarged and inflamed bilateral with exudate. Left tonsil +3, right tonsil +2. No signs of peritonsillar abscess.  Eyes: Conjunctivae are normal. Pupils are equal, round, and reactive to light.  Neck: Normal range of motion.  Cardiovascular: Normal rate, regular rhythm and normal heart sounds.   Pulmonary/Chest: Effort normal and breath sounds normal. No stridor.  Abdominal: Soft. Bowel sounds are normal.  Musculoskeletal: Normal range of motion.  Lymphadenopathy:    She has cervical adenopathy.  Neurological: She is alert and oriented to person, place, and time.  Skin: Skin is warm and dry. She is not diaphoretic.  Psychiatric: She has a normal mood and affect. Her behavior is normal.    ED Course  Procedures (including critical care time)  Labs Reviewed  RAPID STREP SCREEN   No results found.   1. Tonsillitis       MDM  27 year old female with tonsillitis. Rapid strep  negative. She has a raspy voice and has significant pain with swallowing. No evidence of tonsillar abscess. 10 mg IM Decadron given. I advised ibuprofen once she is able to swallow without pain. Lortab elixir given for pain. Return precautions discussed. Patient states understanding of plan and is agreeable.        Trevor Mace, PA-C 02/15/12 1022

## 2013-01-10 ENCOUNTER — Encounter (HOSPITAL_COMMUNITY): Payer: Self-pay | Admitting: Emergency Medicine

## 2013-01-10 ENCOUNTER — Emergency Department (HOSPITAL_COMMUNITY)
Admission: EM | Admit: 2013-01-10 | Discharge: 2013-01-10 | Disposition: A | Discharge status: Home / Self Care | Payer: Medicaid Other | Attending: Emergency Medicine | Admitting: Emergency Medicine

## 2013-01-10 DIAGNOSIS — K0889 Other specified disorders of teeth and supporting structures: Secondary | ICD-10-CM

## 2013-01-10 DIAGNOSIS — K089 Disorder of teeth and supporting structures, unspecified: Secondary | ICD-10-CM | POA: Insufficient documentation

## 2013-01-10 DIAGNOSIS — F17200 Nicotine dependence, unspecified, uncomplicated: Secondary | ICD-10-CM | POA: Insufficient documentation

## 2013-01-10 MED ORDER — HYDROCODONE-ACETAMINOPHEN 5-325 MG PO TABS: 1.0000 | ORAL_TABLET | Freq: Four times a day (QID) | ORAL | Status: DC | PRN

## 2013-01-10 MED ORDER — PENICILLIN V POTASSIUM 500 MG PO TABS: 500.0000 mg | ORAL_TABLET | Freq: Four times a day (QID) | ORAL | Status: AC

## 2013-01-10 NOTE — ED Provider Notes (Signed)
CSN: [id number]     Arrival date & time 01/10/13  1003 History  This chart was scribed for non-physician practitioner working with Katie Crease, MD by Ashley Jacobs, ED scribe. This patient was seen in room TR07C/TR07C and the patient's care was started at 10:45 AM.   First MD Initiated Contact with Patient 01/10/13 1044     Chief Complaint  Patient presents with  . Dental Pain   (Consider location/radiation/quality/duration/timing/severity/associated sxs/prior Treatment) The history is provided by the patient and medical records. No language interpreter was used.   HPI Comments: CALEA HRIBAR is a 28 y.o. female who presents to the Emergency Department complaining of constant, severe, right lower dental pain for the past four days. Pt has a fractured tooth on her right side a few weeks ago.  Pt has tried Oragel for her symptoms but did not notice any improvements of her symptoms.   She does not have any known allergies to medications.  She does not have a dentist.  She denies facial swelling, neck pain, neck stiffness, fever, chills, or difficulty swallowing. History reviewed. No pertinent past medical history. Past Surgical History  Procedure Laterality Date  . Tubal ligation    . Cholecystectomy  11/19/2010    Procedure: LAPAROSCOPIC CHOLECYSTECTOMY;  Surgeon: Shelly Rubenstein, MD;  Location: [facility] OR;  Service: General;  Laterality: N/A;   Family History  Problem Relation Age of Onset  . Kidney cancer    . Lung cancer     History  Substance Use Topics  . Smoking status: Current Every Day Smoker -- 1.00 packs/day for 9 years    Types: Cigarettes  . Smokeless tobacco: Never Used  . Alcohol Use: No     Comment: OCCASIONAL   OB History   Grav Para Term Preterm Abortions TAB SAB Ect Mult Living                 Review of Systems  Constitutional: Negative for fever and chills.  HENT: Positive for dental problem.   All other systems reviewed and are  negative.    Allergies  Review of patient's allergies indicates no known allergies.  Home Medications   Current Outpatient Rx  Name  Route  Sig  Dispense  Refill  . HYDROcodone-acetaminophen (LORTAB) 7.5-500 MG/15ML solution   Oral   Take 15 mLs by mouth every 6 (six) hours as needed for pain.   120 mL   0    BP 114/75  Pulse 67  Temp(Src) 97.7 F (36.5 C) (Oral)  Resp 20  SpO2 100% Physical Exam  Nursing note and vitals reviewed. Constitutional: She is oriented to person, place, and time. She appears well-developed and well-nourished. No distress.  Awake, alert, nontoxic appearance  HENT:  Head: Normocephalic and atraumatic.  Mouth/Throat: Uvula is midline, oropharynx is clear and moist and mucous membranes are normal. No trismus in the jaw. Abnormal dentition. No dental abscesses or uvula swelling. No oropharyngeal exudate, posterior oropharyngeal edema, posterior oropharyngeal erythema or tonsillar abscesses.  Poor dental hygiene. Pt able to open and close mouth with out difficulty. Airway intact. Uvula midline. Mild gingival swelling with tenderness over affected area, but no fluctuance. No swelling or tenderness of submental and submandibular regions.  Eyes: EOM are normal.  Neck: Normal range of motion and full passive range of motion without pain. Neck supple.  Cardiovascular: Normal rate, regular rhythm and intact distal pulses.   Pulmonary/Chest: Effort normal and breath sounds normal. No stridor. No respiratory distress.  She has no wheezes.  Abdominal: Bowel sounds are normal.  Musculoskeletal: Normal range of motion.  Lymphadenopathy:       Head (right side): No submental, no submandibular, no tonsillar, no preauricular and no posterior auricular adenopathy present.       Head (left side): No submental, no submandibular, no tonsillar, no preauricular and no posterior auricular adenopathy present.    She has no cervical adenopathy.  Neurological: She is alert and  oriented to person, place, and time.  Speech is clear and goal oriented Moves extremities without ataxia  Skin: Skin is warm and dry. No rash noted. She is not diaphoretic.  Psychiatric: She has a normal mood and affect.    ED Course  Procedures (including critical care time) DIAGNOSTIC STUDIES: Oxygen Saturation is 100% on room air, normal by my interpretation.    COORDINATION OF CARE:  10:48 AM Discussed course of care with pt . Pt understands and agrees.   Labs Review Labs Reviewed - No data to display Imaging Review No results found.  EKG Interpretation   None       MDM  No diagnosis found. Patient with toothache.  No gross abscess.  Exam unconcerning for Ludwig's angina or spread of infection.  Will treat with penicillin and pain medicine.  Urged patient to follow-up with dentist.    I personally performed the services described in this documentation, which was scribed in my presence. The recorded information has been reviewed and is accurate.     Santiago GladHeather Ellyssa Zagal, PA-C 01/10/13 1148

## 2013-01-10 NOTE — ED Notes (Signed)
Rt side jaw pain and tooth pain x4 days has broken tooth on that side

## 2013-01-10 NOTE — Discharge Instructions (Signed)
You have a dental injury. Use the resource guide listed below to help you find a dentist if you do not already have one to followup with. It is very important that you get evaluated by a dentist as soon as possible. Call tomorrow to schedule an appointment. Use your pain medication as prescribed and do not operate heavy machinery while on pain medication. Note that your pain medication contains acetaminophen (Tylenol) & its is not recommended that you use additional acetaminophen (Tylenol) while taking this medication. Take your full course of antibiotics. Read the instructions below. ° °Eat a soft or liquid diet and rinse your mouth out after meals with warm water. You should see a dentist or return here at once if you have increased swelling, increased pain or uncontrolled bleeding from the site of your injury. ° ° °SEEK MEDICAL CARE IF:  °· You have increased pain not controlled with medicines.  °· You have swelling around your tooth, in your face or neck.  °· You have bleeding which starts, continues, or gets worse.  °· You have a fever >101 °· If you are unable to open your mouth ° °RESOURCE GUIDE ° °Dental Problems ° °Patients with Medicaid: °[facility]                     [facility] °[address]                                           [address] °Phone:  [phone]                                                  Phone:  510-2600 ° °If unable to pay or uninsured, contact:  [facility] or [facility] to become qualified for the adult dental clinic. ° °Chronic Pain Problems °Contact [facility]  [phone] °Patients need to be referred by their primary care doctor. ° °Insufficient Money for Medicine °Contact [employer]:  call "211" or [facility] 271-5999. ° °No Primary Care Doctor °Call [facility]  832-8000 °Other agencies that provide inexpensive medical care °   [facility]  [phone] °   [facility]  [phone] °   [facility]  [phone] °   [facility]  832-4777 °   [facility]  [phone] °   [facility]  [phone] ° °Psychological Services °[facility]  [phone] °[facility]  [phone] °[facility]   [phone] (emergency services 641-4993) ° °Substance Abuse Resources °[facility]  336-882-2125 °[facility] 336-784-9470 °[facility] 336-285-9073 °[facility] 336-845-3988 °Residential & Outpatient Substance Abuse Program  800-659-3381 ° °Abuse/Neglect °[place] [facility] (336) 641-3795 °[place] [facility] Hotline 800-378-5315 (After Hours) ° °Emergency Shelter °[facility] (336) 271-5985 ° °Maternity Homes °[facility] (336) 275-9566 °[facility] (704) 372-4663 ° °MRSA Hotline #:   832-7006 ° ° ° °[place] [facility] ° °[facility] [place] [facility]     [employer]                            [facility] °[address] [place]                       [address]      [address]  °[place]                                                [place]                            [place] °Phone:  349-3220                                   Phone:  [phone]                 Phone:  342-8140 ° °[facility] °Phone:  342-8316 ° °[place] [facility] Hotline °(336) 342-1394 °(336) 342-3537 (After Hours) ° ° ° ° °

## 2013-01-15 NOTE — ED Provider Notes (Signed)
Medical screening examination/treatment/procedure(s) were performed by non-physician practitioner and as supervising physician I was immediately available for consultation/collaboration.  Laria Grimmett J. Maximiano Lott, MD 01/15/13 1058 

## 2013-01-22 ENCOUNTER — Encounter (HOSPITAL_COMMUNITY): Payer: Self-pay | Admitting: Emergency Medicine

## 2013-01-22 ENCOUNTER — Emergency Department (HOSPITAL_COMMUNITY)
Admission: EM | Admit: 2013-01-22 | Discharge: 2013-01-22 | Disposition: A | Discharge status: Home / Self Care | Payer: Medicaid Other | Attending: Emergency Medicine | Admitting: Emergency Medicine

## 2013-01-22 DIAGNOSIS — K002 Abnormalities of size and form of teeth: Secondary | ICD-10-CM | POA: Insufficient documentation

## 2013-01-22 DIAGNOSIS — R220 Localized swelling, mass and lump, head: Secondary | ICD-10-CM | POA: Insufficient documentation

## 2013-01-22 DIAGNOSIS — R000 Tachycardia, unspecified: Secondary | ICD-10-CM | POA: Insufficient documentation

## 2013-01-22 DIAGNOSIS — R221 Localized swelling, mass and lump, neck: Secondary | ICD-10-CM | POA: Insufficient documentation

## 2013-01-22 DIAGNOSIS — K029 Dental caries, unspecified: Secondary | ICD-10-CM | POA: Insufficient documentation

## 2013-01-22 DIAGNOSIS — F17200 Nicotine dependence, unspecified, uncomplicated: Secondary | ICD-10-CM | POA: Insufficient documentation

## 2013-01-22 DIAGNOSIS — K0889 Other specified disorders of teeth and supporting structures: Secondary | ICD-10-CM

## 2013-01-22 DIAGNOSIS — K089 Disorder of teeth and supporting structures, unspecified: Secondary | ICD-10-CM | POA: Insufficient documentation

## 2013-01-22 DIAGNOSIS — K0381 Cracked tooth: Secondary | ICD-10-CM | POA: Insufficient documentation

## 2013-01-22 MED ORDER — PENICILLIN V POTASSIUM 500 MG PO TABS: 500.0000 mg | ORAL_TABLET | Freq: Four times a day (QID) | ORAL | Status: DC

## 2013-01-22 MED ORDER — HYDROCODONE-ACETAMINOPHEN 7.5-325 MG/15ML PO SOLN: 15.0000 mL | Freq: Three times a day (TID) | ORAL | Status: DC | PRN

## 2013-01-22 MED ORDER — HYDROCODONE-ACETAMINOPHEN 5-325 MG PO TABS
2.0000 | ORAL_TABLET | Freq: Once | ORAL | Status: AC
  Administered 2013-01-22: 2 via ORAL
  Filled 2013-01-22: qty 2

## 2013-01-22 MED ORDER — PENICILLIN V POTASSIUM 250 MG PO TABS
500.0000 mg | ORAL_TABLET | Freq: Once | ORAL | Status: AC
  Administered 2013-01-22: 500 mg via ORAL
  Filled 2013-01-22: qty 2

## 2013-01-22 NOTE — ED Notes (Signed)
Pt arrives with left lower tooth and jaw pain for the last 3 days. Face appears swollen on left side. Pt denies fever.

## 2013-01-22 NOTE — Progress Notes (Signed)
Met patient at bedside.Role of CM explained.Patient reports today's ED visit is secondary to increased tooth pain.Patient reports she has no health Insurance or PCP.   Education provided on importance of establishing PCP.Patient provided with resource sheet for the [facility] community clinic .Patient educated with her consent this CM'   Can contact the clinic and set up her PCP appointment.Patient receptive to this.Patient educated once established with a PCP at the clinic she can have her urgent    Medications filled at the clinic pharmacy.Education provided on [employer] prescriptions savings club( certain Antibiotics provided free of charge)    Patient also provided with resources for Emergency dental treatment in [place] area.Teach back method used to ensure patient understanding.

## 2013-01-22 NOTE — Discharge Instructions (Signed)
Take Penicillin as directed until gone. Take vicodin as needed for pain. Follow up with a dentist from the resource guide. Refer to attached documents for more information.    Emergency Department Resource Guide 1) Find a Doctor and Pay Out of Pocket Although you won't have to find out who is covered by your insurance plan, it is a good idea to ask around and get recommendations. You will then need to call the office and see if the doctor you have chosen will accept you as a new patient and what types of options they offer for patients who are self-pay. Some doctors offer discounts or will set up payment plans for their patients who do not have insurance, but you will need to ask so you aren't surprised when you get to your appointment.  2) Contact Your Local Health Department Not all health departments have doctors that can see patients for sick visits, but many do, so it is worth a call to see if yours does. If you don't know where your local health department is, you can check in your phone book. The CDC also has a tool to help you locate your state's health department, and many state websites also have listings of all of their local health departments.  3) Find a Walk-in Clinic If your illness is not likely to be very severe or complicated, you may want to try a walk in clinic. These are popping up all over the country in pharmacies, drugstores, and shopping centers. They're usually staffed by nurse practitioners or physician assistants that have been trained to treat common illnesses and complaints. They're usually fairly quick and inexpensive. However, if you have serious medical issues or chronic medical problems, these are probably not your best option.  No Primary Care Doctor: - Call Health Connect at  [phone] - they can help you locate a primary care doctor that  accepts your insurance, provides certain services, etc. - Physician Referral Service- [phone]  Chronic Pain  Problems: Organization         Address  Phone   Notes  [facility]  [phone] Patients need to be referred by their primary care doctor.   Medication Assistance: Organization         Address  Phone   Notes  [facility] [address] [place], [place] [address] [phone] --Must be a resident of [facility] [place] -- Must have NO insurance coverage whatsoever (no Medicaid/ Medicare, etc.) -- The pt. MUST have a primary care doctor that directs their care regularly and follows them in the community   Med[facility]ist  [phone]   [employer]  [phone]    Agencies that provide inexpensive medical care: Organization         Address  Phone   Notes  [facility]  [phone]   [facility]    [phone]   [facility] [address] [place], [place] [address] [phone]   [facility] [address], [place] [phone]   [facility]anned Parenthood    [phone]   [facility]    [phone]   [facility]  [address], [place] Phone:  [phone], Fax:  [phone] Hours of Operation:  9 am - 6 pm, M-F.  Also accepts Medicaid/Medicare and self-pay.  [facility]  [address]  [place], [place], [place] Phone: [phone], Fax: [phone]. Hours of Operation:  8:30 am - 5:30 pm, M-F.  Also accepts Medicaid and self-pay.  [facility] [place] [address], [place] Phone: [phone]   [place], [place], [place] [phone], Ext. [phone] Mondays & Thursdays: 7-9 AM.  First 15 patients are seen on a first come, first serve basis.    [place] Providers:  Organization         Address  Phone   Notes  [facility] [address], [place] [phone] Also  accepts self-pay patients.  [facility] [address], [place]  [phone]   [place], [address], [place] [phone]   [facility] [address] [place] [phone]   Lucianne Lei [address], [place]   [phone] Only accepts [place] Access [place] patients after they have their name applied to their card.   Self-Pay (no insurance) in [facility]:  Organization         Address  Phone   Notes  Sickle Cell Patients, [facility] [place] [phone]   [facility] [place] [phone]   [facility] [place]  [place], [place],  [phone]   [facility]/Dr. Osei-Bonsu  [address] [place] or [address], [place] [phone] Phone number for both [place] and [place] locations is the same.  [facility] [address], [place] [phone]   [facility] [address] [place] or [address] [phone]   [facility] [address], [place] [phone], phone; [phone], fax Sees patients 1st and 3rd Saturday of every month.  Must not qualify for public or private insurance (i.e. Medicaid, Medicare, [place] Health Choice, Veterans' Benefits)  Household income should be no more than 200% of the poverty level The clinic cannot treat you if you are pregnant or think you are pregnant  Sexually transmitted diseases are not treated at the clinic.    Dental Care: Organization         Address  Phone  Notes  [facility] [place] [phone] Accepts children up to age 34 who are enrolled in [place] or [place]; pregnant  women with a Medicaid card; and children who have applied for Medicaid or [place] Health Choice, but were declined, whose parents can pay a reduced fee at time of service.  [facility]  [address], [place] [phone] Accepts children up to age 24 who are enrolled in [place] or [place]; pregnant women with a Medicaid card; and children who have applied for Medicaid or [place] Health Choice, but were declined, whose parents can pay a reduced fee at time of service.  [facility]  [place] [phone] Patients are seen by appointment only. Walk-ins are not accepted. [place] will see patients 57 years of age and older. Monday - Tuesday (8am-5pm) Most Wednesdays (8:30-5pm) $30 per visit, cash only  [place]  [address], [place] [phone] Patients are seen by appointment only. Walk-ins are not accepted. [facility] will see patients 18 years of age and older. °One Wednesday Evening (Monthly: Volunteer Based).  $30 per visit, cash only  °[facility]  [phone] for adults; Children under age 4, call Graduate Pediatric Dentistry at [phone]. Children aged 4-14, please call [phone] to request a pediatric application. ° Dental services are provided in all areas of dental care including fillings, crowns and bridges, complete and partial dentures, implants, gum treatment, root canals, and extractions. Preventive care is also provided. Treatment is provided to both adults and children. °Patients are selected via a lottery and there is often a waiting list. °  °[facility] [address], °[place] ° [phone] [email or web address].com °  °[facility] [address], [place], [place] [phone], Ext. [phone] Second and Fourth Thursday of each month, opens at 6:30 AM; Clinic ends at 9 AM.  Patients are  seen on a first-come first-served basis, and a limited number are seen during each clinic.  ° °[facility] ° [address], [place], [place] [phone]   Eligibility Requirements °You must have lived in [place], [place], or [place] counties for at least the last three months. °  You cannot be eligible for state or federal sponsored healthcare insurance, including Veterans Administration, Medicaid, or Medicare. °  You generally cannot be eligible for healthcare insurance through your employer.  °  How to apply: °Eligibility screenings are held every Tuesday and Wednesday afternoon from 1:00 pm until 4:00 pm. You do not need an appointment for the interview!  °[facility] [address], [place], [place] [phone]   °[facility]  [phone]   °[facility]  [phone]   °[facility]  336-570-6415   ° °Behavioral Health Resources in the Community: °Intensive Outpatient Programs °Organization         Address  Phone  Notes  °[facility] [address], [place], [place] [phone]   °[facility] [address], [place], [place] [phone]   °[facility] [address], [place], [place] ° 336-882-2125   °[facility] [address],  °[place], [place] 1-800-853-5163 or 336-641-4981   °Substance Abuse Resources °Organization         Address  Phone  Notes  °[facility]  336-882-2125   °[facility]  336-784-9470   °[facility]  336-285-9073   °[facility]  336-845-3988   °Residential & Outpatient Substance Abuse Program  1-800-659-3381   °Psychological Services °Organization         Address  Phone  Notes  ° Health  336- [phone]   °[facility]  336- 378-7881   °[facility] [address], [place] 1-800-853-5163 or 336-641-4981   ° °Mobile Crisis  Teams °Organization         Address  Phone  Notes  °[facility], Mobile Crisis Care Unit  1-877-626-1772   °Assertive °Psychotherapeutic Services ° [address] [place], [place] 336-834-9664   °Sharon DeEsch [address] °[place] 336-554-5454   ° °Self-Help/Support Groups °Organization         Address  Phone             Notes  °[facility] [place] -   variety of support groups  336- 336-389-6044 Call for more information  Narcotics Anonymous (NA), [facility] [address], [employer] [place]  2 meetings at this location   Residential Facilities manager         Address  Phone  Notes  [facility] [place],    [place]  1-463-457-0038   [facility]  [address] [place] [address], [place], [place]   [place], [place] [phone] Admissions: 8am-3pm M-F  [facility] [place] [address],    [place], [place] [phone]   [facility] [address] [place], [place], [place]   [facility] [address]  [place], [place]   [facility] - Intensive Outpatient [address], [place], [place]   [facility] [place] [address]  [place], [place] 1-640-386-3300 or 325-254-7062   [facility] ([facility]) [address] [place], [place] Accepts Medicaid  [facility] [place] [address],  [place] 1-646-193-9807 Substance Abuse/Addiction Treatment   [facility] Organization         Address  Phone  Notes  [facility]  [phone]   Domenic Schwab, PhD [address] [place] [place], [place]   (925) [phone] or [phone]   [facility] [place], [place] [phone]   [facility] [address] [place], [place] [phone]  Insurance/Medicaid/sponsorship through [facility] and Families [address], [address]                                    [place], [place] [phone] [place] [address], [place] [phone]    Dr. Adele Schilder  [phone]   [facility] Dept. 1) [address] [place] 2) [place] 3)  [place] [address], [place] [phone]  [phone]   [place] [phone] or [phone] (After Hours)

## 2013-01-22 NOTE — ED Provider Notes (Signed)
CSN: [id number]     Arrival date & time 01/22/13  0815 History   First MD Initiated Contact with Patient 01/22/13 [phone]     Chief Complaint  Patient presents with  . Dental Pain   (Consider location/radiation/quality/duration/timing/severity/associated sxs/prior Treatment) HPI Comments: The patient is a 28 year old otherwise healthy female who presents with dental pain that started 3 days ago The dental pain is severe, constant and progressively worsening. The pain is aching and located in left lower jaw. The pain does not radiate. Eating makes the pain worse. Nothing makes the pain better. The patient has not tried anything for pain. No associated symptoms. Patient denies headache, neck pain/stiffness, fever, NVD, edema, sore throat, throat swelling, wheezing, SOB, chest pain, abdominal pain.     Patient is a 28 y.o. female presenting with tooth pain.  Dental Pain Associated symptoms: facial swelling   Associated symptoms: no fever and no neck pain     No past medical history on file. Past Surgical History  Procedure Laterality Date  . Tubal ligation    . Cholecystectomy  11/19/2010    Procedure: LAPAROSCOPIC CHOLECYSTECTOMY;  Surgeon: Shelly Rubensteinouglas A Blackman, MD;  Location: [facility] OR;  Service: General;  Laterality: N/A;   Family History  Problem Relation Age of Onset  . Kidney cancer    . Lung cancer     History  Substance Use Topics  . Smoking status: Current Every Day Smoker -- 1.00 packs/day for 9 years    Types: Cigarettes  . Smokeless tobacco: Never Used  . Alcohol Use: Yes     Comment: OCCASIONAL   OB History   Grav Para Term Preterm Abortions TAB SAB Ect Mult Living                 Review of Systems  Constitutional: Negative for fever, chills and fatigue.  HENT: Positive for dental problem and facial swelling. Negative for trouble swallowing.   Eyes: Negative for visual disturbance.  Respiratory: Negative for shortness of breath.   Cardiovascular: Negative for chest  pain and palpitations.  Gastrointestinal: Negative for nausea, vomiting, abdominal pain and diarrhea.  Genitourinary: Negative for dysuria and difficulty urinating.  Musculoskeletal: Negative for arthralgias and neck pain.  Skin: Negative for color change.  Neurological: Negative for dizziness and weakness.  Psychiatric/Behavioral: Negative for dysphoric mood.    Allergies  Review of patient's allergies indicates no known allergies.  Home Medications   Current Outpatient Rx  Name  Route  Sig  Dispense  Refill  . HYDROcodone-acetaminophen (NORCO/VICODIN) 5-325 MG per tablet   Oral   Take 1-2 tablets by mouth every 6 (six) hours as needed.   15 tablet   0   . ibuprofen (ADVIL,MOTRIN) 200 MG tablet   Oral   Take 400 mg by mouth every 6 (six) hours as needed for moderate pain.          BP 121/81  Pulse 113  Temp(Src) 98.3 F (36.8 C)  Resp 18  SpO2 100%  LMP 01/14/2013 Physical Exam  Nursing note and vitals reviewed. Constitutional: She is oriented to person, place, and time. She appears well-developed and well-nourished. No distress.  HENT:  Head: Normocephalic and atraumatic.  Mouth/Throat: Oropharynx is clear and moist. No oropharyngeal exudate.  Poor dentition. Multiple decayed and cracked teeth. Left lower jaw swelling with tenderness to palpation without identifiable abscess.   Eyes: Conjunctivae and EOM are normal. No scleral icterus.  Neck: Normal range of motion.  Cardiovascular: Normal rate and  regular rhythm.  Exam reveals no gallop and no friction rub.   No murmur heard. Pulmonary/Chest: Effort normal and breath sounds normal. She has no wheezes. She has no rales. She exhibits no tenderness.  Musculoskeletal: Normal range of motion.  Neurological: She is alert and oriented to person, place, and time. Coordination normal.  Speech is goal-oriented. Moves limbs without ataxia.   Skin: Skin is warm and dry.  Psychiatric: She has a normal mood and affect. Her  behavior is normal.    ED Course  Procedures (including critical care time) Labs Review Labs Reviewed - No data to display Imaging Review No results found.  EKG Interpretation   None       MDM   1. Pain, dental     9:07 AM Patient will have antibiotics and pain medication. Patient did not follow up with a dentist from the resource guide as directed last visit. Patient given dental resources and will follow up. Patient is tachycardic with remaining vitals stable.     Emilia Beck, PA-C 01/22/13 [phone]

## 2013-01-23 NOTE — ED Provider Notes (Signed)
Medical screening examination/treatment/procedure(s) were conducted as a shared visit with non-physician practitioner(s) and myself.  I personally evaluated the patient during the encounter.  EKG Interpretation   None       Pt c/o left lower dental pain. Moderate. Pt alert, content. Afeb. No facial swelling.   Suzi RootsKevin E Trell Secrist, MD 01/23/13 [phone]

## 2013-02-01 ENCOUNTER — Telehealth: Payer: Self-pay | Admitting: *Deleted

## 2013-02-01 NOTE — Telephone Encounter (Signed)
Spoke with message she will set up appt with Artistinancial Counselor.

## 2013-03-24 ENCOUNTER — Other Ambulatory Visit: Payer: Self-pay

## 2013-03-24 ENCOUNTER — Emergency Department (HOSPITAL_COMMUNITY)
Admission: EM | Admit: 2013-03-24 | Discharge: 2013-03-24 | Disposition: A | Discharge status: Home / Self Care | Payer: Medicaid Other | Attending: Emergency Medicine | Admitting: Emergency Medicine

## 2013-03-24 ENCOUNTER — Emergency Department (HOSPITAL_COMMUNITY): Payer: Medicaid Other

## 2013-03-24 ENCOUNTER — Encounter (HOSPITAL_COMMUNITY): Payer: Self-pay | Admitting: Emergency Medicine

## 2013-03-24 DIAGNOSIS — Z3202 Encounter for pregnancy test, result negative: Secondary | ICD-10-CM | POA: Insufficient documentation

## 2013-03-24 DIAGNOSIS — N12 Tubulo-interstitial nephritis, not specified as acute or chronic: Secondary | ICD-10-CM | POA: Insufficient documentation

## 2013-03-24 DIAGNOSIS — Z9089 Acquired absence of other organs: Secondary | ICD-10-CM | POA: Insufficient documentation

## 2013-03-24 DIAGNOSIS — F17200 Nicotine dependence, unspecified, uncomplicated: Secondary | ICD-10-CM | POA: Insufficient documentation

## 2013-03-24 DIAGNOSIS — Z79899 Other long term (current) drug therapy: Secondary | ICD-10-CM | POA: Insufficient documentation

## 2013-03-24 DIAGNOSIS — Z9851 Tubal ligation status: Secondary | ICD-10-CM | POA: Insufficient documentation

## 2013-03-24 LAB — COMPREHENSIVE METABOLIC PANEL
ALT: 13 U/L (ref 0–35)
AST: 15 U/L (ref 0–37)
Albumin: 3.6 g/dL (ref 3.5–5.2)
Alkaline Phosphatase: 82 U/L (ref 39–117)
BUN: 9 mg/dL (ref 6–23)
CO2: 29 mEq/L (ref 19–32)
Calcium: 9.3 mg/dL (ref 8.4–10.5)
Chloride: 100 mEq/L (ref 96–112)
Creatinine, Ser: 0.66 mg/dL (ref 0.50–1.10)
GFR calc Af Amer: >90 mL/min (ref >90)
GFR calc non Af Amer: >90 mL/min (ref >90)
Glucose, Bld: 107 mg/dL — ABNORMAL HIGH (ref 70–99)
Potassium: 3.7 mEq/L (ref 3.7–5.3)
Sodium: 141 mEq/L (ref 137–147)
Total Bilirubin: 0.3 mg/dL (ref 0.3–1.2)
Total Protein: 7.4 g/dL (ref 6.0–8.3)

## 2013-03-24 LAB — URINALYSIS, ROUTINE W REFLEX MICROSCOPIC
Bilirubin Urine: NEGATIVE
Glucose, UA: NEGATIVE mg/dL
Ketones, ur: NEGATIVE mg/dL
Nitrite: POSITIVE — AB
Protein, ur: >300 mg/dL — AB
Specific Gravity, Urine: 1.026 (ref 1.005–1.030)
Urobilinogen, UA: 0.2 mg/dL (ref 0.0–1.0)
pH: 6.5 (ref 5.0–8.0)

## 2013-03-24 LAB — CBC WITH DIFFERENTIAL/PLATELET
Basophils Absolute: 0 10*3/uL (ref 0.0–0.1)
Basophils Relative: 0 % (ref 0–1)
Eosinophils Absolute: 0.2 10*3/uL (ref 0.0–0.7)
Eosinophils Relative: 1 % (ref 0–5)
HCT: 38.2 % (ref 36.0–46.0)
Hemoglobin: 13.1 g/dL (ref 12.0–15.0)
Lymphocytes Relative: 19 % (ref 12–46)
Lymphs Abs: 3.1 10*3/uL (ref 0.7–4.0)
MCH: 31 pg (ref 26.0–34.0)
MCHC: 34.3 g/dL (ref 30.0–36.0)
MCV: 90.3 fL (ref 78.0–100.0)
Monocytes Absolute: 0.8 10*3/uL (ref 0.1–1.0)
Monocytes Relative: 5 % (ref 3–12)
Neutro Abs: 12.8 10*3/uL — ABNORMAL HIGH (ref 1.7–7.7)
Neutrophils Relative %: 76 % (ref 43–77)
Platelets: 295 10*3/uL (ref 150–400)
RBC: 4.23 MIL/uL (ref 3.87–5.11)
RDW: 13.7 % (ref 11.5–15.5)
WBC: 17 10*3/uL — ABNORMAL HIGH (ref 4.0–10.5)

## 2013-03-24 LAB — WET PREP, GENITAL
Trich, Wet Prep: NONE SEEN
Yeast Wet Prep HPF POC: NONE SEEN

## 2013-03-24 LAB — URINE MICROSCOPIC-ADD ON

## 2013-03-24 LAB — POC URINE PREG, ED: Preg Test, Ur: NEGATIVE

## 2013-03-24 MED ORDER — MORPHINE SULFATE 4 MG/ML IJ SOLN
4.0000 mg | Freq: Once | INTRAMUSCULAR | Status: AC
  Administered 2013-03-24: 4 mg via INTRAVENOUS
  Filled 2013-03-24: qty 1

## 2013-03-24 MED ORDER — ONDANSETRON HCL 4 MG/2ML IJ SOLN
4.0000 mg | Freq: Once | INTRAMUSCULAR | Status: AC
  Administered 2013-03-24: 4 mg via INTRAVENOUS
  Filled 2013-03-24: qty 2

## 2013-03-24 MED ORDER — DEXTROSE 5 % IV SOLN
1.0000 g | INTRAVENOUS | Status: DC
  Administered 2013-03-24: 1 g via INTRAVENOUS
  Filled 2013-03-24: qty 10

## 2013-03-24 MED ORDER — CIPROFLOXACIN HCL 500 MG PO TABS: 500.0000 mg | ORAL_TABLET | Freq: Two times a day (BID) | ORAL | Status: DC

## 2013-03-24 MED ORDER — HYDROCODONE-ACETAMINOPHEN 5-325 MG PO TABS: 1.0000 | ORAL_TABLET | ORAL | Status: DC | PRN

## 2013-03-24 MED ORDER — PROMETHAZINE HCL 25 MG PO TABS: 25.0000 mg | ORAL_TABLET | Freq: Four times a day (QID) | ORAL | Status: DC | PRN

## 2013-03-24 MED ORDER — HYDROMORPHONE HCL PF 1 MG/ML IJ SOLN
1.0000 mg | Freq: Once | INTRAMUSCULAR | Status: AC
  Administered 2013-03-24: 1 mg via INTRAVENOUS
  Filled 2013-03-24: qty 1

## 2013-03-24 MED ORDER — SODIUM CHLORIDE 0.9 % IV BOLUS (SEPSIS)
1000.0000 mL | Freq: Once | INTRAVENOUS | Status: AC
  Administered 2013-03-24: 1000 mL via INTRAVENOUS

## 2013-03-24 NOTE — Discharge Instructions (Signed)
You were seen and evaluated for your lower abdominal pain and back pains. At this time your providers feel your symptoms are coming from a bad urinary tract infection possibly spreading towards your kidneys. You were given antibiotics in the emergency room as well as prescriptions to continue taking antibiotics for the next 10 days. Please take these as prescribed for the full length of time. Followup with your primary care provider. Return to the emergency department for any changing or worsening symptoms.    Pyelonephritis, Adult Pyelonephritis is a kidney infection. In general, there are 2 main types of pyelonephritis:  Infections that come on quickly without any warning (acute pyelonephritis).  Infections that persist for a long period of time (chronic pyelonephritis). CAUSES  Two main causes of pyelonephritis are:  Bacteria traveling from the bladder to the kidney. This is a problem especially in pregnant women. The urine in the bladder can become filled with bacteria from multiple causes, including:  Inflammation of the prostate gland (prostatitis).  Sexual intercourse in females.  Bladder infection (cystitis).  Bacteria traveling from the bloodstream to the tissue part of the kidney. Problems that may increase your risk of getting a kidney infection include:  Diabetes.  Kidney stones or bladder stones.  Cancer.  Catheters placed in the bladder.  Other abnormalities of the kidney or ureter. SYMPTOMS   Abdominal pain.  Pain in the side or flank area.  Fever.  Chills.  Upset stomach.  Blood in the urine (dark urine).  Frequent urination.  Strong or persistent urge to urinate.  Burning or stinging when urinating. DIAGNOSIS  Your caregiver may diagnose your kidney infection based on your symptoms. A urine sample may also be taken. TREATMENT  In general, treatment depends on how severe the infection is.   If the infection is mild and caught early, your  caregiver may treat you with oral antibiotics and send you home.  If the infection is more severe, the bacteria may have gotten into the bloodstream. This will require intravenous (IV) antibiotics and a hospital stay. Symptoms may include:  High fever.  Severe flank pain.  Shaking chills.  Even after a hospital stay, your caregiver may require you to be on oral antibiotics for a period of time.  Other treatments may be required depending upon the cause of the infection. HOME CARE INSTRUCTIONS   Take your antibiotics as directed. Finish them even if you start to feel better.  Make an appointment to have your urine checked to make sure the infection is gone.  Drink enough fluids to keep your urine clear or pale yellow.  Take medicines for the bladder if you have urgency and frequency of urination as directed by your caregiver. SEEK IMMEDIATE MEDICAL CARE IF:   You have a fever or persistent symptoms for more than 2-3 days.  You have a fever and your symptoms suddenly get worse.  You are unable to take your antibiotics or fluids.  You develop shaking chills.  You experience extreme weakness or fainting.  There is no improvement after 2 days of treatment. MAKE SURE YOU:  Understand these instructions.  Will watch your condition.  Will get help right away if you are not doing well or get worse. Document Released: 12/20/2004 Document Revised: 06/21/2011 Document Reviewed: 05/26/2010 [facility] Patient Information 2014 [place] [place].

## 2013-03-24 NOTE — ED Notes (Signed)
Pt back from ultrasound, pt is laughing, stating she feels much better.

## 2013-03-24 NOTE — ED Notes (Signed)
LLQ abd pain on and off for days; worsening today; nausea. Soft stools

## 2013-03-24 NOTE — ED Provider Notes (Signed)
CSN: [id number]     Arrival date & time 03/24/13  1918 History   First MD Initiated Contact with Patient 03/24/13 2000     Chief Complaint  Patient presents with  . Abdominal Pain   HPI  History provided by the patient. The patient is a 28 year old female with prior history of tubal ligation, cholecystectomy who presents with complaints of worsened constant left lower abdominal pain with associated nausea and vomiting. Patient reports having some intermittent pain and cramps for the past few days. Today she suddenly began having gradually worsening steady sharp lower abdominal pain. Complains of pain primarily on the left lower quadrant radiating into the back. Severe pain has been associated with nausea and vomiting. She has not used any medications or treatment for symptoms. Denies any specific aggravating factors. She denies any associated fever, chills or sweats. Denies any dysuria, hematuria or urinary frequency. No vaginal bleeding or discharge. No change in menstruation. No diarrhea or constipation. No other associated symptoms.    History reviewed. No pertinent past medical history. Past Surgical History  Procedure Laterality Date  . Tubal ligation    . Cholecystectomy  11/19/2010    Procedure: LAPAROSCOPIC CHOLECYSTECTOMY;  Surgeon: Shelly Rubensteinouglas A Blackman, MD;  Location: [facility] OR;  Service: General;  Laterality: N/A;   Family History  Problem Relation Age of Onset  . Kidney cancer    . Lung cancer     History  Substance Use Topics  . Smoking status: Current Every Day Smoker -- 1.00 packs/day for 9 years    Types: Cigarettes  . Smokeless tobacco: Never Used  . Alcohol Use: Yes     Comment: OCCASIONAL   OB History   Grav Para Term Preterm Abortions TAB SAB Ect Mult Living                 Review of Systems  Constitutional: Negative for fever, chills and diaphoresis.  Respiratory: Negative for cough and shortness of breath.   Cardiovascular: Negative for chest pain.   Gastrointestinal: Positive for nausea, vomiting and abdominal pain. Negative for constipation.  Genitourinary: Negative for dysuria, frequency, hematuria, flank pain, vaginal bleeding and vaginal discharge.  Musculoskeletal: Positive for back pain.  All other systems reviewed and are negative.      Allergies  Review of patient's allergies indicates no known allergies.  Home Medications   Current Outpatient Rx  Name  Route  Sig  Dispense  Refill  . HYDROcodone-acetaminophen (HYCET) 7.5-325 mg/15 ml solution   Oral   Take 15 mLs by mouth every 8 (eight) hours as needed for moderate pain.   120 mL   0   . HYDROcodone-acetaminophen (NORCO/VICODIN) 5-325 MG per tablet   Oral   Take 1-2 tablets by mouth every 6 (six) hours as needed.   15 tablet   0   . ibuprofen (ADVIL,MOTRIN) 200 MG tablet   Oral   Take 400 mg by mouth every 6 (six) hours as needed for moderate pain.         Marland Kitchen. penicillin v potassium (VEETID) 500 MG tablet   Oral   Take 1 tablet (500 mg total) by mouth 4 (four) times daily.   40 tablet   0    BP 131/82  Pulse 52  Temp(Src) 98.1 F (36.7 C) (Oral)  Resp 18  Ht 5\' 2"  (1.575 m)  Wt 126 lb (57.153 kg)  BMI 23.04 kg/m2  SpO2 100%  LMP 03/09/2013 Physical Exam  Nursing note and vitals reviewed. Constitutional:  She is oriented to person, place, and time. She appears well-developed and well-nourished. She appears distressed.  HENT:  Head: Normocephalic.  Cardiovascular: Normal rate and regular rhythm.   Pulmonary/Chest: Effort normal and breath sounds normal. No respiratory distress. She has no wheezes. She has no rales.  Respirations normal.  Abdominal: Soft. Bowel sounds are decreased. There is tenderness in the right lower quadrant, suprapubic area and left lower quadrant. There is rebound. There is no guarding, no CVA tenderness, no tenderness at McBurney's point and negative Murphy's sign.  Diffuse abdominal tenderness greatest in lower quadrants  especially on left.  Pt reports worse pain with rebound. Negative Rovsing.    Genitourinary:  Chaperone present. Patient with clear to white vaginal discharge. No bleeding. No friability. Cervix closed. CMT with reproducible similar pain. No adnexal mass. There is pain diffusely across adnexa and cervix and uterus area.  Musculoskeletal: Normal range of motion.  Neurological: She is alert and oriented to person, place, and time.  Skin: Skin is warm and dry. No rash noted.  Psychiatric: She has a normal mood and affect. Her behavior is normal.    ED Course  Procedures   DIAGNOSTIC STUDIES: Oxygen Saturation is 100% on room air.  COORDINATION OF CARE:  Nursing notes reviewed. Vital signs reviewed. Initial pt interview and examination performed.   8:10 PM-patient seen and evaluated. Patient appears in moderate distress in pain and discomfort. On active episode of vomiting due to pain. Discussed work up plan with pt at bedside, which includes labs, pelvic and possible Ct vs. [place]. Pt agrees with plan.  8:45 PM patient with significant pain and tenderness on pelvic exam. Moderate amount of clear to white vaginal discharge. Exam concerning for possible PID.  Patient however also has significant signs for UTI. No significant CVA tenderness however she is having nausea and vomiting which may also be signs of early pyelonephritis. At this time we'll order pelvic ultrasound to rule out any complicating pelvic infection.  11:00 PM. Ultrasound appears normal. No signs of ovarian cysts or torsion. No signs of TOA or significant signs of PID. Given lab testing at this time suspect early pyelonephritis. Patient was given dose of ceftriaxone. Will continue outpatient on Cipro for 10 days. I discussed with patient diagnosis and she will plan. She agrees. She was given strict return precautions.   Treatment plan initiated: Medications  morphine 4 MG/ML injection 4 mg (not administered)  ondansetron (ZOFRAN)  injection 4 mg (not administered)    Results for orders placed during the hospital encounter of 03/24/13  WET PREP, GENITAL      Result Value Ref Range   Yeast Wet Prep HPF POC NONE SEEN  NONE SEEN   Trich, Wet Prep NONE SEEN  NONE SEEN   Clue Cells Wet Prep HPF POC FEW (*) NONE SEEN   WBC, Wet Prep HPF POC FEW (*) NONE SEEN  COMPREHENSIVE METABOLIC PANEL      Result Value Ref Range   Sodium 141  137 - 147 mEq/L   Potassium 3.7  3.7 - 5.3 mEq/L   Chloride 100  96 - 112 mEq/L   CO2 29  19 - 32 mEq/L   Glucose, Bld 107 (*) 70 - 99 mg/dL   BUN 9  6 - 23 mg/dL   Creatinine, Ser 8.65  0.50 - 1.10 mg/dL   Calcium 9.3  8.4 - 78.4 mg/dL   Total Protein 7.4  6.0 - 8.3 g/dL   Albumin 3.6  3.5 -  5.2 g/dL   AST 15  0 - 37 U/L   ALT 13  0 - 35 U/L   Alkaline Phosphatase 82  39 - 117 U/L   Total Bilirubin 0.3  0.3 - 1.2 mg/dL   GFR calc non Af Amer >90  >90 mL/min   GFR calc Af Amer >90  >90 mL/min  CBC WITH DIFFERENTIAL      Result Value Ref Range   WBC 17.0 (*) 4.0 - 10.5 K/uL   RBC 4.23  3.87 - 5.11 MIL/uL   Hemoglobin 13.1  12.0 - 15.0 g/dL   HCT 81.1  91.4 - 78.2 %   MCV 90.3  78.0 - 100.0 fL   MCH 31.0  26.0 - 34.0 pg   MCHC 34.3  30.0 - 36.0 g/dL   RDW 95.6  21.3 - 08.6 %   Platelets 295  150 - 400 K/uL   Neutrophils Relative % 76  43 - 77 %   Neutro Abs 12.8 (*) 1.7 - 7.7 K/uL   Lymphocytes Relative 19  12 - 46 %   Lymphs Abs 3.1  0.7 - 4.0 K/uL   Monocytes Relative 5  3 - 12 %   Monocytes Absolute 0.8  0.1 - 1.0 K/uL   Eosinophils Relative 1  0 - 5 %   Eosinophils Absolute 0.2  0.0 - 0.7 K/uL   Basophils Relative 0  0 - 1 %   Basophils Absolute 0.0  0.0 - 0.1 K/uL  URINALYSIS, ROUTINE W REFLEX MICROSCOPIC      Result Value Ref Range   Color, Urine YELLOW  YELLOW   APPearance TURBID (*) CLEAR   Specific Gravity, Urine 1.026  1.005 - 1.030   pH 6.5  5.0 - 8.0   Glucose, UA NEGATIVE  NEGATIVE mg/dL   Hgb urine dipstick LARGE (*) NEGATIVE   Bilirubin Urine NEGATIVE   NEGATIVE   Ketones, ur NEGATIVE  NEGATIVE mg/dL   Protein, ur >578 (*) NEGATIVE mg/dL   Urobilinogen, UA 0.2  0.0 - 1.0 mg/dL   Nitrite POSITIVE (*) NEGATIVE   Leukocytes, UA MODERATE (*) NEGATIVE  URINE MICROSCOPIC-ADD ON      Result Value Ref Range   WBC, UA TOO NUMEROUS TO COUNT  <3 WBC/hpf   RBC / HPF 7-10  <3 RBC/hpf   Bacteria, UA MANY (*) RARE  POC URINE PREG, ED      Result Value Ref Range   Preg Test, Ur NEGATIVE  NEGATIVE      Imaging Review US Transvaginal Non-ob  03/24/2013   CLINICAL DATA:  Left lower quadrant pain.  EXAM: TRANSABDOMINAL AND TRANSVAGINAL ULTRASOUND OF PELVIS  DOPPLER ULTRASOUND OF OVARIES  TECHNIQUE: Both transabdominal and transvaginal ultrasound examinations of the pelvis were performed. Transabdominal technique was performed for global imaging of the pelvis including uterus, ovaries, adnexal regions, and pelvic cul-de-sac.  It was necessary to proceed with endovaginal exam following the transabdominal exam to visualize the uterus, endometrium, and ovaries. Color and duplex Doppler ultrasound was utilized to evaluate blood flow to the ovaries.  COMPARISON:  Pelvic CT 03/20/2008  FINDINGS: Uterus  Measurements: 9.7 x 4.3 x 5.2 cm. Echotexture is minimally heterogeneous without fibroid or other focal lesion identified. A small amount of fluid was noted in the cervical canal.  Endometrium  Thickness: 10.6 mm.  No focal abnormality visualized.  Right ovary  Measurements: 2.9 x 1.3 x 1.8 cm. Normal appearance/no adnexal mass.  Left ovary  Measurements: 3.8 x 1.9 x  2.3 cm. Normal appearance/no adnexal mass.  Pulsed Doppler evaluation of both ovaries demonstrates normal low-resistance arterial and venous waveforms.  Other findings  Trace free fluid in the pelvis.  IMPRESSION: 1. Unremarkable appearance of the ovaries. No evidence of torsion during this examination. 2. Trace free fluid in the pelvis.   Electronically Signed   By: Sebastian Ache   On: 03/24/2013 22:42   US  Pelvis Complete  03/24/2013   CLINICAL DATA:  Left lower quadrant pain.  EXAM: TRANSABDOMINAL AND TRANSVAGINAL ULTRASOUND OF PELVIS  DOPPLER ULTRASOUND OF OVARIES  TECHNIQUE: Both transabdominal and transvaginal ultrasound examinations of the pelvis were performed. Transabdominal technique was performed for global imaging of the pelvis including uterus, ovaries, adnexal regions, and pelvic cul-de-sac.  It was necessary to proceed with endovaginal exam following the transabdominal exam to visualize the uterus, endometrium, and ovaries. Color and duplex Doppler ultrasound was utilized to evaluate blood flow to the ovaries.  COMPARISON:  Pelvic CT 03/20/2008  FINDINGS: Uterus  Measurements: 9.7 x 4.3 x 5.2 cm. Echotexture is minimally heterogeneous without fibroid or other focal lesion identified. A small amount of fluid was noted in the cervical canal.  Endometrium  Thickness: 10.6 mm.  No focal abnormality visualized.  Right ovary  Measurements: 2.9 x 1.3 x 1.8 cm. Normal appearance/no adnexal mass.  Left ovary  Measurements: 3.8 x 1.9 x 2.3 cm. Normal appearance/no adnexal mass.  Pulsed Doppler evaluation of both ovaries demonstrates normal low-resistance arterial and venous waveforms.  Other findings  Trace free fluid in the pelvis.  IMPRESSION: 1. Unremarkable appearance of the ovaries. No evidence of torsion during this examination. 2. Trace free fluid in the pelvis.   Electronically Signed   By: Sebastian Ache   On: 03/24/2013 22:42   [place] Art/ven Flow Abd Pelv Doppler  03/24/2013   CLINICAL DATA:  Left lower quadrant pain.  EXAM: TRANSABDOMINAL AND TRANSVAGINAL ULTRASOUND OF PELVIS  DOPPLER ULTRASOUND OF OVARIES  TECHNIQUE: Both transabdominal and transvaginal ultrasound examinations of the pelvis were performed. Transabdominal technique was performed for global imaging of the pelvis including uterus, ovaries, adnexal regions, and pelvic cul-de-sac.  It was necessary to proceed with endovaginal exam following  the transabdominal exam to visualize the uterus, endometrium, and ovaries. Color and duplex Doppler ultrasound was utilized to evaluate blood flow to the ovaries.  COMPARISON:  Pelvic CT 03/20/2008  FINDINGS: Uterus  Measurements: 9.7 x 4.3 x 5.2 cm. Echotexture is minimally heterogeneous without fibroid or other focal lesion identified. A small amount of fluid was noted in the cervical canal.  Endometrium  Thickness: 10.6 mm.  No focal abnormality visualized.  Right ovary  Measurements: 2.9 x 1.3 x 1.8 cm. Normal appearance/no adnexal mass.  Left ovary  Measurements: 3.8 x 1.9 x 2.3 cm. Normal appearance/no adnexal mass.  Pulsed Doppler evaluation of both ovaries demonstrates normal low-resistance arterial and venous waveforms.  Other findings  Trace free fluid in the pelvis.  IMPRESSION: 1. Unremarkable appearance of the ovaries. No evidence of torsion during this examination. 2. Trace free fluid in the pelvis.   Electronically Signed   By: Sebastian Ache   On: 03/24/2013 22:42       EKG Interpretation None      Date: 03/24/2013  Rate: 69  Rhythm: normal sinus rhythm and sinus arrhythmia  QRS Axis: normal  Intervals: normal  ST/T Wave abnormalities: normal  Conduction Disutrbances:none  Narrative Interpretation:   Old EKG Reviewed: none available    MDM  Final diagnoses:  Pyelonephritis        Angus Seller, PA-C 03/24/13 2304

## 2013-03-24 NOTE — ED Notes (Signed)
After pelvic exam pt crying with c/o increased pain.

## 2013-03-24 NOTE — ED Notes (Signed)
Onset 2-3 days left side and left flank pain, pain worse and constant  today.  No vomiting or fever.  Pt does have nausea.   No dysuria, pt does not urinary urgency.  No other s/s noted.

## 2013-03-24 NOTE — ED Notes (Signed)
Patient transported to Ultrasound 

## 2013-03-25 LAB — GC/CHLAMYDIA PROBE AMP
CT Probe RNA: NEGATIVE
GC Probe RNA: NEGATIVE

## 2013-03-25 LAB — HIV ANTIBODY (ROUTINE TESTING W REFLEX): HIV: NONREACTIVE

## 2013-03-26 LAB — URINE CULTURE: Colony Count: >=100000

## 2013-03-26 NOTE — ED Provider Notes (Signed)
Medical screening examination/treatment/procedure(s) were performed by non-physician practitioner and as supervising physician I was immediately available for consultation/collaboration.   EKG Interpretation None        Junius ArgyleForrest S Kaitlyn Franko, MD 03/26/13 2038

## 2013-08-09 ENCOUNTER — Emergency Department (HOSPITAL_COMMUNITY)
Admission: EM | Admit: 2013-08-09 | Discharge: 2013-08-09 | Disposition: A | Discharge status: Home / Self Care | Payer: Medicaid Other | Source: Home / Self Care | Attending: Family Medicine | Admitting: Family Medicine

## 2013-08-09 ENCOUNTER — Other Ambulatory Visit (HOSPITAL_COMMUNITY)
Admission: RE | Admit: 2013-08-09 | Discharge: 2013-08-09 | Disposition: A | Discharge status: Home / Self Care | Payer: Medicaid Other | Source: Ambulatory Visit | Attending: Family Medicine | Admitting: Family Medicine

## 2013-08-09 ENCOUNTER — Encounter (HOSPITAL_COMMUNITY): Payer: Self-pay | Admitting: Family Medicine

## 2013-08-09 DIAGNOSIS — Z113 Encounter for screening for infections with a predominantly sexual mode of transmission: Secondary | ICD-10-CM | POA: Insufficient documentation

## 2013-08-09 DIAGNOSIS — N898 Other specified noninflammatory disorders of vagina: Secondary | ICD-10-CM

## 2013-08-09 DIAGNOSIS — N390 Urinary tract infection, site not specified: Secondary | ICD-10-CM

## 2013-08-09 DIAGNOSIS — N760 Acute vaginitis: Secondary | ICD-10-CM | POA: Diagnosis present

## 2013-08-09 LAB — POCT URINALYSIS DIP (DEVICE)
Glucose, UA: 100 mg/dL — AB
Ketones, ur: NEGATIVE mg/dL
Nitrite: POSITIVE — AB
Protein, ur: >=300 mg/dL — AB
Specific Gravity, Urine: 1.02 (ref 1.005–1.030)
Urobilinogen, UA: 1 mg/dL (ref 0.0–1.0)
pH: 5.5 (ref 5.0–8.0)

## 2013-08-09 LAB — POCT PREGNANCY, URINE: Preg Test, Ur: NEGATIVE

## 2013-08-09 MED ORDER — FLUCONAZOLE 150 MG PO TABS: 150.0000 mg | ORAL_TABLET | Freq: Every day | ORAL | Status: DC

## 2013-08-09 MED ORDER — CEPHALEXIN 500 MG PO CAPS: 500.0000 mg | ORAL_CAPSULE | Freq: Three times a day (TID) | ORAL | Status: DC

## 2013-08-09 NOTE — ED Provider Notes (Signed)
CSN: [id number]     Arrival date & time 08/09/13  1053 History   None    Chief Complaint  Patient presents with  . SEXUALLY TRANSMITTED DISEASE   (Consider location/radiation/quality/duration/timing/severity/associated sxs/prior Treatment) HPI  Dysuria: associated w/ abd cramping and frequency. Started 7 days ago. Getting worse. Tried cranberry extract w/o benefit. Denies fevers, side pain, rash. Associated w/ mild vaginal discharge. W/ boyfriend w/o symptoms of STD but does not use condoms.     History reviewed. No pertinent past medical history. Past Surgical History  Procedure Laterality Date  . Tubal ligation    . Cholecystectomy  11/19/2010    Procedure: LAPAROSCOPIC CHOLECYSTECTOMY;  Surgeon: Shelly Rubenstein, MD;  Location: [facility] OR;  Service: General;  Laterality: N/A;   Family History  Problem Relation Age of Onset  . Kidney cancer    . Lung cancer     History  Substance Use Topics  . Smoking status: Current Every Day Smoker -- 0.50 packs/day for 9 years    Types: Cigarettes  . Smokeless tobacco: Never Used  . Alcohol Use: Yes     Comment: OCCASIONAL   OB History   Grav Para Term Preterm Abortions TAB SAB Ect Mult Living                 Review of Systems Per HPI with all other pertinent systems negative.   Allergies  Review of patient's allergies indicates no known allergies.  Home Medications   Prior to Admission medications   Medication Sig Start Date End Date Taking? Authorizing Provider  cephALEXin (KEFLEX) 500 MG capsule Take 1 capsule (500 mg total) by mouth 3 (three) times daily. 08/09/13   Ozella Rocks, MD  ciprofloxacin (CIPRO) 500 MG tablet Take 1 tablet (500 mg total) by mouth 2 (two) times daily. One po bid x 7 days 03/24/13   Phill Mutter Dammen, PA-C  fluconazole (DIFLUCAN) 150 MG tablet Take 1 tablet (150 mg total) by mouth daily. Repeat dose in 3 days 08/09/13   Ozella Rocks, MD  HYDROcodone-acetaminophen (NORCO/VICODIN) 5-325 MG per tablet Take  1-2 tablets by mouth every 4 (four) hours as needed for moderate pain. 03/24/13   Phill Mutter Dammen, PA-C  promethazine (PHENERGAN) 25 MG tablet Take 1 tablet (25 mg total) by mouth every 6 (six) hours as needed for nausea. 03/24/13   Phill Mutter Dammen, PA-C   BP 137/88  Pulse 62  Temp(Src) 98.1 F (36.7 C) (Oral)  Resp 16  SpO2 98%  LMP 08/02/2013 Physical Exam  Constitutional: She is oriented to person, place, and time. She appears well-developed and well-nourished. No distress.  HENT:  Head: Normocephalic and atraumatic.  Eyes: EOM are normal. Pupils are equal, round, and reactive to light.  Neck: Normal range of motion.  Cardiovascular: Normal rate.   Pulmonary/Chest: Effort normal and breath sounds normal.  Abdominal: Soft.  Suprapubic tenderness  Genitourinary: Vaginal discharge found.  Musculoskeletal:  No CVA tenderness  Neurological: She is alert and oriented to person, place, and time.  Skin: Skin is warm. No rash noted. She is not diaphoretic. No erythema. No pallor.    ED Course  Procedures (including critical care time) Labs Review Labs Reviewed  POCT URINALYSIS DIP (DEVICE) - Abnormal; Notable for the following:    Glucose, UA 100 (*)    Bilirubin Urine SMALL (*)    Hgb urine dipstick LARGE (*)    Protein, ur >=300 (*)    Nitrite POSITIVE (*)  Leukocytes, UA LARGE (*)    All other components within normal limits  POCT PREGNANCY, URINE  CERVICOVAGINAL ANCILLARY ONLY    Imaging Review No results found.   MDM   1. Vaginal discharge   2. Recurrent UTI    Keflex 500 TID x 7 days Diflucan for possible yeast infection Wet prep, Gc, Chl sent HIV and RPR recently done by PCP Precautions given and all questions answered  Shelly Flattenavid Breah Joa, MD Family Medicine 08/09/2013, 11:42 AM      Ozella Rocksavid J Denece Shearer, MD 08/09/13 [phone]

## 2013-08-09 NOTE — Discharge Instructions (Signed)
You have a urinary tract infection Please start your antibiotics  Please take the diflucan if you get a yeas infection We will call you if any of tyour other labs come back positive  Urinary Tract Infection Urinary tract infections (UTIs) can develop anywhere along your urinary tract. Your urinary tract is your body's drainage system for removing wastes and extra water. Your urinary tract includes two kidneys, two ureters, a bladder, and a urethra. Your kidneys are a pair of bean-shaped organs. Each kidney is about the size of your fist. They are located below your ribs, one on each side of your spine. CAUSES Infections are caused by microbes, which are microscopic organisms, including fungi, viruses, and bacteria. These organisms are so small that they can only be seen through a microscope. Bacteria are the microbes that most commonly cause UTIs. SYMPTOMS  Symptoms of UTIs may vary by age and gender of the patient and by the location of the infection. Symptoms in young women typically include a frequent and intense urge to urinate and a painful, burning feeling in the bladder or urethra during urination. Older women and men are more likely to be tired, shaky, and weak and have muscle aches and abdominal pain. A fever may mean the infection is in your kidneys. Other symptoms of a kidney infection include pain in your back or sides below the ribs, nausea, and vomiting. DIAGNOSIS To diagnose a UTI, your caregiver will ask you about your symptoms. Your caregiver also will ask to provide a urine sample. The urine sample will be tested for bacteria and white blood cells. White blood cells are made by your body to help fight infection. TREATMENT  Typically, UTIs can be treated with medication. Because most UTIs are caused by a bacterial infection, they usually can be treated with the use of antibiotics. The choice of antibiotic and length of treatment depend on your symptoms and the type of bacteria causing  your infection. HOME CARE INSTRUCTIONS  If you were prescribed antibiotics, take them exactly as your caregiver instructs you. Finish the medication even if you feel better after you have only taken some of the medication.  Drink enough water and fluids to keep your urine clear or pale yellow.  Avoid caffeine, tea, and carbonated beverages. They tend to irritate your bladder.  Empty your bladder often. Avoid holding urine for long periods of time.  Empty your bladder before and after sexual intercourse.  After a bowel movement, women should cleanse from front to back. Use each tissue only once. SEEK MEDICAL CARE IF:   You have back pain.  You develop a fever.  Your symptoms do not begin to resolve within 3 days. SEEK IMMEDIATE MEDICAL CARE IF:   You have severe back pain or lower abdominal pain.  You develop chills.  You have nausea or vomiting.  You have continued burning or discomfort with urination. MAKE SURE YOU:   Understand these instructions.  Will watch your condition.  Will get help right away if you are not doing well or get worse. Document Released: 09/29/2004 Document Revised: 06/21/2011 Document Reviewed: 01/28/2011 [facility] Patient Information 2015 [employer] [place]. This information is not intended to replace advice given to you by your health care provider. Make sure you discuss any questions you have with your health care provider.

## 2013-08-09 NOTE — ED Notes (Signed)
Call back number for lab issues verified at release  

## 2013-08-09 NOTE — ED Notes (Signed)
C/o lower abdominal pain and worse cramping than usual w last menses that ended a couple days ago; sexually  Active w sporadic condom use; vaginal secretions have changed

## 2013-08-09 NOTE — ED Notes (Signed)
Urine specimen collected.

## 2013-08-12 ENCOUNTER — Encounter (HOSPITAL_COMMUNITY): Payer: Self-pay | Admitting: Emergency Medicine

## 2013-08-17 ENCOUNTER — Telehealth: Payer: Self-pay | Admitting: Family Medicine

## 2013-08-17 MED ORDER — METRONIDAZOLE 500 MG PO TABS: 500.0000 mg | ORAL_TABLET | Freq: Three times a day (TID) | ORAL | Status: DC

## 2013-08-17 NOTE — Telephone Encounter (Signed)
BV noted Start metro 

## 2013-08-26 ENCOUNTER — Telehealth (HOSPITAL_COMMUNITY): Payer: Self-pay | Admitting: *Deleted

## 2013-08-26 NOTE — ED Notes (Signed)
I called pt. Pt. verified x 2 and given results.  Pt. Told she needs Flagyl for bacterial vaginosis.  Pt. states she came to [facility] last week and was told the Rx. was at the [facility] on [address]. She went there and they did not have it.  I explained that whomever told her that did not notice that the Rx. was printed and not e-prescribed. I told her I would call it in for her.  She asked for it to be called to [facility] on [address]. I told her I would do that.  Pt. instructed to no alcohol while taking this medication.  Pt. voiced understanding.  Rx. called to pharmacist @ [phone].

## 2013-09-05 MED ORDER — METRONIDAZOLE 500 MG PO TABS: 500.0000 mg | ORAL_TABLET | Freq: Two times a day (BID) | ORAL | Status: DC

## 2013-09-05 NOTE — Addendum Note (Signed)
Addended by: Ozella Rocks on: 09/05/2013 08:21 PM   Modules accepted: Orders

## 2013-12-22 ENCOUNTER — Encounter (HOSPITAL_BASED_OUTPATIENT_CLINIC_OR_DEPARTMENT_OTHER): Payer: Self-pay | Admitting: *Deleted

## 2013-12-22 ENCOUNTER — Emergency Department (HOSPITAL_BASED_OUTPATIENT_CLINIC_OR_DEPARTMENT_OTHER)
Admission: EM | Admit: 2013-12-22 | Discharge: 2013-12-22 | Disposition: A | Discharge status: Home / Self Care | Payer: Medicaid Other | Attending: Emergency Medicine | Admitting: Emergency Medicine

## 2013-12-22 DIAGNOSIS — Z79899 Other long term (current) drug therapy: Secondary | ICD-10-CM | POA: Diagnosis not present

## 2013-12-22 DIAGNOSIS — Z792 Long term (current) use of antibiotics: Secondary | ICD-10-CM | POA: Diagnosis not present

## 2013-12-22 DIAGNOSIS — Z720 Tobacco use: Secondary | ICD-10-CM | POA: Diagnosis not present

## 2013-12-22 DIAGNOSIS — K029 Dental caries, unspecified: Secondary | ICD-10-CM | POA: Diagnosis not present

## 2013-12-22 DIAGNOSIS — K088 Other specified disorders of teeth and supporting structures: Secondary | ICD-10-CM | POA: Diagnosis present

## 2013-12-22 MED ORDER — PENICILLIN V POTASSIUM 500 MG PO TABS: 500.0000 mg | ORAL_TABLET | Freq: Four times a day (QID) | ORAL | Status: AC

## 2013-12-22 MED ORDER — PENICILLIN V POTASSIUM 250 MG PO TABS
500.0000 mg | ORAL_TABLET | Freq: Once | ORAL | Status: AC
  Administered 2013-12-22: 500 mg via ORAL
  Filled 2013-12-22: qty 2

## 2013-12-22 MED ORDER — KETOROLAC TROMETHAMINE 60 MG/2ML IM SOLN
60.0000 mg | Freq: Once | INTRAMUSCULAR | Status: AC
  Administered 2013-12-22: 60 mg via INTRAMUSCULAR
  Filled 2013-12-22: qty 2

## 2013-12-22 MED ORDER — ONDANSETRON 8 MG PO TBDP
ORAL_TABLET | ORAL | Status: AC
  Filled 2013-12-22: qty 1

## 2013-12-22 MED ORDER — HYDROCODONE-ACETAMINOPHEN 7.5-325 MG/15ML PO SOLN: 10.0000 mL | Freq: Four times a day (QID) | ORAL | Status: DC | PRN

## 2013-12-22 MED ORDER — ONDANSETRON 8 MG PO TBDP
8.0000 mg | ORAL_TABLET | Freq: Once | ORAL | Status: AC
  Administered 2013-12-22: 8 mg via ORAL

## 2013-12-22 NOTE — ED Notes (Signed)
Mild vagal-like sx with injection. Pt states, "I hate needles", c/o nausea.

## 2013-12-22 NOTE — Discharge Instructions (Signed)
Dental Caries °Dental caries is tooth decay. This decay can cause a hole in teeth (cavity) that can get bigger and deeper over time. °HOME CARE °· Brush and floss your teeth. Do this at least two times a day. °· Use a fluoride toothpaste. °· Use a mouth rinse if told by your dentist or doctor. °· Eat less sugary and starchy foods. Drink less sugary drinks. °· Avoid snacking often on sugary and starchy foods. Avoid sipping often on sugary drinks. °· Keep regular checkups and cleanings with your dentist. °· Use fluoride supplements if told by your dentist or doctor. °· Allow fluoride to be applied to teeth if told by your dentist or doctor. °Document Released: 09/29/2007 Document Revised: 05/06/2013 Document Reviewed: 12/23/2011 °ExitCare® Patient Information ©2015 [employer]. This information is not intended to replace advice given to you by your health care provider. Make sure you discuss any questions you have with your health care provider. ° °Emergency Department Resource Guide °1) Find a Doctor and Pay Out of Pocket °Although you won't have to find out who is covered by your insurance plan, it is a good idea to ask around and get recommendations. You will then need to call the office and see if the doctor you have chosen will accept you as a new patient and what types of options they offer for patients who are self-pay. Some doctors offer discounts or will set up payment plans for their patients who do not have insurance, but you will need to ask so you aren't surprised when you get to your appointment. ° °2) Contact Your Local Health Department °Not all health departments have doctors that can see patients for sick visits, but many do, so it is worth a call to see if yours does. If you don't know where your local health department is, you can check in your phone book. The CDC also has a tool to help you locate your state's health department, and many state websites also have listings of all of their local  health departments. ° °3) Find a Walk-in Clinic °If your illness is not likely to be very severe or complicated, you may want to try a walk in clinic. These are popping up all over the country in pharmacies, drugstores, and shopping centers. They're usually staffed by nurse practitioners or physician assistants that have been trained to treat common illnesses and complaints. They're usually fairly quick and inexpensive. However, if you have serious medical issues or chronic medical problems, these are probably not your best option. ° °No Primary Care Doctor: °- Call [facility] at  832-8000 - they can help you locate a primary care doctor that  accepts your insurance, provides certain services, etc. °- Physician Referral Service- 1-800-533-3463 ° °Chronic Pain Problems: °Organization         Address  Phone   Notes  °[facility]  (336) 297-2271 Patients need to be referred by their primary care doctor.  ° °Medication Assistance: °Organization         Address  Phone   Notes  °[place] [facility] [address] °[place], [place] [address] (336) 641-8030 --Must be a resident of [place] °-- Must have NO insurance coverage whatsoever (no Medicaid/ Medicare, etc.) °-- The pt. MUST have a primary care doctor that directs their care regularly and follows them in the community °  °[facility]  (866) 331-1348   °[employer]  (888) 892-1162   ° °Agencies that   provide inexpensive medical care: °Organization         Address  Phone   Notes  °[facility]  (336) 832-8035   °[facility]    (336) 832-7272   °[facility] [address] °[place], [place] [address] (336) 832-4777   °[facility] [address], °[place] (336) 271-4999   °[facility]    (336) 373-0678   °[facility]    (336) 272-1050   °[facility] ° [address], [place] Phone:   (336) 832-4444, Fax:  (336) 832-4440 Hours of Operation:  9 am - 6 pm, M-F.  Also accepts Medicaid/Medicare and self-pay.  °[facility] ° [address], [place] Phone: (336) 832-3150, Fax: (336) 832-3151. Hours of Operation:  8:30 am - 5:30 pm, M-F.  Also accepts Medicaid and self-pay.  °[facility] [place] [address], [place] Phone: (336) 878-6027   °[facility] [address], [place], [place] (336)723-1848, Ext. 123 Mondays & Thursdays: 7-9 AM.  First 15 patients are seen on a first come, first serve basis. °  ° °Medicaid-accepting [place] Providers: ° °Organization         Address  Phone   Notes  °[facility] [address], [place] (336) 641-2100 Also accepts self-pay patients.  °[facility] [address], [place] ° (336) 856-9996   °[facility] [address], [place] (336) 288-8857   °[facility] [address] [place] [address], [place] (336) 299-7000   °Veita Bland [address], [place]  ° (336) 373-1557 Only accepts [place] Access Medicaid patients after they have their name applied to their card.  ° °Self-Pay (no insurance) in [place]: ° °Organization         Address  Phone   Notes  °Sickle Cell Patients, [facility] [address], [place] (336) 832-1970   °[facility] [address], [place] (336) 832-4400   °[facility] ° [address],  (336) 992-4800   °[facility]/Dr. Osei-Bonsu ° [address] [place] [address], [place] or [address], [place] (336) 841-8500 Phone number for both [place] and [place] locations is the same.  °[facility] [address], [place] (336) 299-0000   °[facility] [address] [place] [address], [place] or [address] (336)  852-7530 °(336) 878-2260   °[facility] [address], [place] (336) 350-1642, phone; (336) 294-5005, fax Sees patients 1st and 3rd Saturday of every month.  Must not qualify for public or private insurance (i.e. Medicaid, Medicare, [place] Health Choice, Veterans' Benefits) • Household income should be no more than 200% of the poverty level •The clinic cannot treat you if you are pregnant or think you are pregnant • Sexually transmitted diseases are not treated at the clinic.  ° ° °Dental Care: °Organization         Address  Phone  Notes  °[facility] [address],  (336) 641-6152 Accepts children up to age 21 who are enrolled in Medicaid or [place] Health Choice; pregnant women with a Medicaid card; and children who have applied for Medicaid or [place] Health   Choice, but were declined, whose parents can pay a reduced fee at time of service.  °[facility] [place]  [address], [place] (336) 641-7733 Accepts children up to age 21 who are enrolled in Medicaid or [place] Health Choice; pregnant women with a Medicaid card; and children who have applied for Medicaid or [place] Health Choice, but were declined, whose parents can pay a reduced fee at time of service.  °[facility] ° [address], [place] (336) 641-4533 Patients are seen by appointment only. Walk-ins are not accepted. [facility] will see patients 18 years of age and older. °Monday - Tuesday (8am-5pm) °Most Wednesdays (8:30-5pm) °$30 per visit, cash only  °[facility] ° [address], [place] (336) 641-4533 Patients are seen by appointment only. Walk-ins are not accepted. [facility] will see patients 18 years of age and older. °One Wednesday Evening (Monthly: Volunteer Based).  $30 per visit, cash only  °[facility]  (919) 537-3737 for adults;  Children under age 4, call Graduate Pediatric Dentistry at (919) [phone]. Children aged 4-14, please call (919) [phone] to request a pediatric application. ° Dental services are provided in all areas of dental care including fillings, crowns and bridges, complete and partial dentures, implants, gum treatment, root canals, and extractions. Preventive care is also provided. Treatment is provided to both adults and children. °Patients are selected via a lottery and there is often a waiting list. °  °[facility] [address], °[place] ° (336) 763-8833 [email or web address] °  °[facility] [address], [place], [place] (336)723-1848, Ext. 123 Second and Fourth Thursday of each month, opens at 6:30 AM; Clinic ends at 9 AM.  Patients are seen on a first-come first-served basis, and a limited number are seen during each clinic.  ° °[facility] ° [address], [place], [place] (336) 723-7904   Eligibility Requirements °You must have lived in [place], [place], or [place] counties for at least the last three months. °  You cannot be eligible for state or federal sponsored healthcare insurance, including Veterans Administration, Medicaid, or Medicare. °  You generally cannot be eligible for healthcare insurance through your employer.  °  How to apply: °Eligibility screenings are held every Tuesday and Wednesday afternoon from 1:00 pm until 4:00 pm. You do not need an appointment for the interview!  °[facility] [address], [place], [place] 336-631-2330   °[facility]  336-342-8273   °[facility]  336-703-3100   °[facility]  336-570-6415   ° °Behavioral Health Resources in the Community: °Intensive Outpatient Programs °Organization         Address  Phone  Notes  °[facility] [address], [place], [place] 336-878-6098   °[facility] [address], [place], [place] 336-832-9800   °[facility] [address], [place], [place] ° 336-882-2125   °[facility] [address],  °[place], [place] 1-800-853-5163 or 336-641-4981   °Substance Abuse Resources °Organization         Address  Phone  Notes  °[facility]  336-882-2125   °[facility]  336-784-9470   °[facility]  336-285-9073   °[facility]  336-845-3988   °Residential & Outpatient Substance Abuse Program  1-800-659-3381   °  Psychological Services °Organization         Address  Phone  Notes  ° Health  336- 832-9600   °[facility]  336- 378-7881   °[facility] [address], [place] 1-800-853-5163 or 336-641-4981   ° °Mobile Crisis Teams °Organization         Address  Phone  Notes  °[facility], Mobile Crisis Care Unit  1-877-626-1772   °Assertive °[facility] ° [address] [place], [place] 336-834-9664   °Sharon DeEsch [address] °[place] 336-554-5454   ° °Self-Help/Support Groups °Organization         Address  Phone             Notes  °[facility] - variety of support groups  336- 373-1402 Call for more information  °Narcotics Anonymous (NA), [facility] [address], °[place]  2 meetings at this location  ° °Residential Treatment Programs °Organization         Address  Phone  Notes  °[facility] [address],    °[place]  1-866-801-8205   °[facility] ° [address], [place], [place] 704-293-8524   °[facility] [address], [place] 336-845-3988 Admissions: 8am-3pm M-F  °[facility] [address]    °[place], [place] 336-841-1104   °[facility] [address], [place], [place] 336-379-7146   °[facility] [address]  °[place], [place] 336-285-9073   °[facility] - Intensive  Outpatient [address], [place], [place] 336-852-3033   °[facility] [address]  °[place], [place] 1-877-615-2722 or 336-784-9470   °[facility] [address] [place], [place] 336-227-7417 Accepts Medicaid  °[facility] [address]  °[place] 1-800-659-3381 Substance Abuse/Addiction Treatment  ° °[place] Behavioral Health Resources °Organization         Address  Phone  Notes  °[facility]  (888) 581-9988   °Julie Brannon, PhD [address] [place], [place]   (336) 349-5553 or (336) 951-0000   °[facility]   [address] °[place], [place] (336) 349-4454   °[facility] [address], [place], [place] (336) 342-8316 Insurance/Medicaid/sponsorship through [facility]  °[facility] [address]                                    [place], [place] (336) 342-8316 Therapy/tele-psych/case  °[facility] [address]  ° [place], [place] (336) 349-2233    °Dr. Arfeen  (336) 349-4544   °[facility] [place]  [employer] [place] [facility] 1) [address], [place] °2) [address], [place] °3)  [address], [place] (336) 349-3220 °(336) 342-7768 ° °(336) 342-8140   °[place] [facility] (336) 342-1394 or (336) 342-3537 (After Hours)    ° ° ° °

## 2013-12-22 NOTE — ED Notes (Signed)
Dr. Palumbo in to room. 

## 2013-12-22 NOTE — ED Provider Notes (Signed)
CSN: [id number]     Arrival date & time 12/22/13  0431 History   First MD Initiated Contact with Patient 12/22/13 0602     Chief Complaint  Patient presents with  . Dental Pain     (Consider location/radiation/quality/duration/timing/severity/associated sxs/prior Treatment) Patient is a 28 y.o. female presenting with tooth pain. The history is provided by the patient.  Dental Pain Location:  Lower Lower teeth location:  20/LL 2nd bicuspid, 19/LL 1st molar and 18/LL 2nd molar Quality:  Dull Severity:  Severe Onset quality:  Gradual Timing:  Constant Progression:  Worsening Chronicity:  Chronic Context: dental caries and dental fracture   Relieved by:  Nothing Worsened by:  Nothing tried Ineffective treatments:  None tried Associated symptoms: no neck swelling   Risk factors: smoking     History reviewed. No pertinent past medical history. Past Surgical History  Procedure Laterality Date  . Tubal ligation    . Cholecystectomy  11/19/2010    Procedure: LAPAROSCOPIC CHOLECYSTECTOMY;  Surgeon: Shelly Rubensteinouglas A Blackman, MD;  Location: [facility] OR;  Service: General;  Laterality: N/A;   Family History  Problem Relation Age of Onset  . Kidney cancer    . Lung cancer     History  Substance Use Topics  . Smoking status: Current Every Day Smoker -- 0.50 packs/day for 9 years    Types: Cigarettes  . Smokeless tobacco: Never Used  . Alcohol Use: Yes     Comment: OCCASIONAL   OB History    No data available     Review of Systems  HENT: Positive for dental problem.   All other systems reviewed and are negative.     Allergies  Review of patient's allergies indicates no known allergies.  Home Medications   Prior to Admission medications   Medication Sig Start Date End Date Taking? Authorizing Provider  cephALEXin (KEFLEX) 500 MG capsule Take 1 capsule (500 mg total) by mouth 3 (three) times daily. 08/09/13   Ozella Rocksavid J Merrell, MD  ciprofloxacin (CIPRO) 500 MG tablet Take 1 tablet  (500 mg total) by mouth 2 (two) times daily. One po bid x 7 days 03/24/13   Phill MutterPeter S Dammen, PA-C  fluconazole (DIFLUCAN) 150 MG tablet Take 1 tablet (150 mg total) by mouth daily. Repeat dose in 3 days 08/09/13   Ozella Rocksavid J Merrell, MD  HYDROcodone-acetaminophen (HYCET) 7.5-325 mg/15 ml solution Take 10 mLs by mouth every 6 (six) hours as needed for moderate pain or severe pain. 12/22/13   [place] Corella K Lene Mckay-Rasch, MD  HYDROcodone-acetaminophen (NORCO/VICODIN) 5-325 MG per tablet Take 1-2 tablets by mouth every 4 (four) hours as needed for moderate pain. 03/24/13   Phill MutterPeter S Dammen, PA-C  metroNIDAZOLE (FLAGYL) 500 MG tablet Take 1 tablet (500 mg total) by mouth 2 (two) times daily. 09/05/13   Ozella Rocksavid J Merrell, MD  penicillin v potassium (VEETID) 500 MG tablet Take 1 tablet (500 mg total) by mouth 4 (four) times daily. 12/22/13 12/29/13  Jonesha Tsuchiya K Bohden Dung-Rasch, MD  promethazine (PHENERGAN) 25 MG tablet Take 1 tablet (25 mg total) by mouth every 6 (six) hours as needed for nausea. 03/24/13   Phill MutterPeter S Dammen, PA-C   BP 108/89 mmHg  Pulse 83  Temp(Src) 98.6 F (37 C) (Oral)  Resp 20  Ht 5\' 2"  (1.575 m)  Wt 125 lb (56.7 kg)  BMI 22.86 kg/m2  SpO2 100%  LMP 11/22/2013 Physical Exam  Constitutional: She is oriented to person, place, and time. She appears well-developed and well-nourished. No distress.  HENT:  Head: Normocephalic and atraumatic.  Mouth/Throat: Oropharynx is clear and moist. No trismus in the jaw. Abnormal dentition. Dental caries present. No dental abscesses or uvula swelling.  Eyes: Conjunctivae and EOM are normal.  Neck: Normal range of motion. Neck supple. No tracheal deviation present.  Cardiovascular: Normal rate and regular rhythm.   Pulmonary/Chest: Effort normal and breath sounds normal. She has no wheezes. She has no rales.  Abdominal: Soft. Bowel sounds are normal. There is no tenderness. There is no rebound and no guarding.  Musculoskeletal: Normal range of motion.  Lymphadenopathy:     She has no cervical adenopathy.  Neurological: She is alert and oriented to person, place, and time.  Skin: Skin is warm and dry.  Psychiatric: She has a normal mood and affect.    ED Course  Procedures (including critical care time) Labs Review Labs Reviewed - No data to display  Imaging Review No results found.   EKG Interpretation None      MDM   Final diagnoses:  Dental caries    Sleeping soundly upon entrance and fall asleep during exam.  Admits she has not followed up.  She needs to follow up with a dentist for ongoing care.  Resource guide given    Jaquay Posthumus K Assyria Morreale-Rasch, MD 12/22/13 [phone]

## 2013-12-22 NOTE — ED Notes (Signed)
C/o L lower dental pain, onset 3d ago, h/o similar, poor dentition, also reports some dizziness Saturday morning, (denies: nv, drainage, fever, swelling, radiation or other sx), no meds PTA, took ibuprofen at 1200 Saturday w/o relief, rates 8/10.

## 2013-12-22 NOTE — ED Notes (Signed)
Pt states, "feel better, ready to go", alert, NAD, calm, interactive, resps e/u, speech clear, ambulatory in room, steady gait, given Rx x2, (denies: needs, sx, questions or concerns unmet).

## 2014-02-01 ENCOUNTER — Emergency Department (HOSPITAL_COMMUNITY)
Admission: EM | Admit: 2014-02-01 | Discharge: 2014-02-01 | Disposition: A | Discharge status: Home / Self Care | Payer: Medicaid Other | Attending: Emergency Medicine | Admitting: Emergency Medicine

## 2014-02-01 ENCOUNTER — Encounter (HOSPITAL_COMMUNITY): Payer: Self-pay | Admitting: *Deleted

## 2014-02-01 DIAGNOSIS — Z79899 Other long term (current) drug therapy: Secondary | ICD-10-CM | POA: Diagnosis not present

## 2014-02-01 DIAGNOSIS — K088 Other specified disorders of teeth and supporting structures: Secondary | ICD-10-CM | POA: Diagnosis present

## 2014-02-01 DIAGNOSIS — Z720 Tobacco use: Secondary | ICD-10-CM | POA: Insufficient documentation

## 2014-02-01 DIAGNOSIS — Z792 Long term (current) use of antibiotics: Secondary | ICD-10-CM | POA: Diagnosis not present

## 2014-02-01 DIAGNOSIS — K029 Dental caries, unspecified: Secondary | ICD-10-CM | POA: Diagnosis not present

## 2014-02-01 MED ORDER — HYDROCODONE-ACETAMINOPHEN 5-325 MG PO TABS
1.0000 | ORAL_TABLET | ORAL | Status: AC
  Administered 2014-02-01: 1 via ORAL
  Filled 2014-02-01: qty 1

## 2014-02-01 MED ORDER — HYDROCODONE-ACETAMINOPHEN 5-325 MG PO TABS: 1.0000 | ORAL_TABLET | Freq: Four times a day (QID) | ORAL | Status: DC | PRN

## 2014-02-01 MED ORDER — PENICILLIN V POTASSIUM 250 MG PO TABS
500.0000 mg | ORAL_TABLET | Freq: Once | ORAL | Status: AC
  Administered 2014-02-01: 500 mg via ORAL
  Filled 2014-02-01: qty 2

## 2014-02-01 MED ORDER — PENICILLIN V POTASSIUM 500 MG PO TABS: 500.0000 mg | ORAL_TABLET | Freq: Three times a day (TID) | ORAL | Status: DC

## 2014-02-01 NOTE — ED Provider Notes (Signed)
CSN: [id number]     Arrival date & time 02/01/14  2211 History   First MD Initiated Contact with Patient 02/01/14 2231     Chief Complaint  Patient presents with  . Dental Problem     HPI Pt has a history of dental caries.  She has seen a dentist and was referred to an oral surgeon but has not received any treatment yet.  A couple of days ago she started developing pain and this morning she noticed swelling in the right lower mandibular area.  The pain increases with chewing. She has not had any fevers. She denies any trouble with shortness of breath. No difficulty swallowing. No fevers or chills. History reviewed. No pertinent past medical history. Past Surgical History  Procedure Laterality Date  . Tubal ligation    . Cholecystectomy  11/19/2010    Procedure: LAPAROSCOPIC CHOLECYSTECTOMY;  Surgeon: Shelly Rubensteinouglas A Blackman, MD;  Location: [facility] OR;  Service: General;  Laterality: N/A;   Family History  Problem Relation Age of Onset  . Kidney cancer    . Lung cancer     History  Substance Use Topics  . Smoking status: Current Every Day Smoker -- 0.50 packs/day for 9 years    Types: Cigarettes  . Smokeless tobacco: Never Used  . Alcohol Use: Yes     Comment: OCCASIONAL   OB History    No data available     Review of Systems  All other systems reviewed and are negative.     Allergies  Review of patient's allergies indicates no known allergies.  Home Medications   Prior to Admission medications   Medication Sig Start Date End Date Taking? Authorizing Provider  cephALEXin (KEFLEX) 500 MG capsule Take 1 capsule (500 mg total) by mouth 3 (three) times daily. 08/09/13   Ozella Rocksavid J Merrell, MD  ciprofloxacin (CIPRO) 500 MG tablet Take 1 tablet (500 mg total) by mouth 2 (two) times daily. One po bid x 7 days 03/24/13   Phill MutterPeter S Dammen, PA-C  fluconazole (DIFLUCAN) 150 MG tablet Take 1 tablet (150 mg total) by mouth daily. Repeat dose in 3 days 08/09/13   Ozella Rocksavid J Merrell, MD    HYDROcodone-acetaminophen (NORCO/VICODIN) 5-325 MG per tablet Take 1-2 tablets by mouth every 6 (six) hours as needed for moderate pain. 02/01/14   [place] DibblesJon Mikey Maffett, MD  metroNIDAZOLE (FLAGYL) 500 MG tablet Take 1 tablet (500 mg total) by mouth 2 (two) times daily. 09/05/13   Ozella Rocksavid J Merrell, MD  penicillin v potassium (VEETID) 500 MG tablet Take 1 tablet (500 mg total) by mouth 3 (three) times daily. 02/01/14   [place] DibblesJon Sumeya Yontz, MD  promethazine (PHENERGAN) 25 MG tablet Take 1 tablet (25 mg total) by mouth every 6 (six) hours as needed for nausea. 03/24/13   Phill MutterPeter S Dammen, PA-C   BP 129/67 mmHg  Pulse 100  Temp(Src) 98 F (36.7 C) (Oral)  Resp 16  SpO2 98% Physical Exam  Constitutional: She appears well-developed and well-nourished. No distress.  HENT:  Head: Normocephalic and atraumatic.    Right Ear: External ear normal.  Left Ear: External ear normal.  Mouth/Throat: No oral lesions. No trismus in the jaw. Dental caries present. No uvula swelling.    Eyes: Conjunctivae are normal. Right eye exhibits no discharge. Left eye exhibits no discharge. No scleral icterus.  Neck: Neck supple. No tracheal deviation present.  Cardiovascular: Normal rate.   Pulmonary/Chest: Effort normal. No stridor. No respiratory distress.  Musculoskeletal: She exhibits no edema.  Neurological: She is alert. Cranial nerve deficit: no gross deficits.  Skin: Skin is warm and dry. No rash noted.  Psychiatric: She has a normal mood and affect.  Nursing note and vitals reviewed.   ED Course  Procedures (including critical care time) Labs Review Labs Reviewed - No data to display  Imaging Review No results found.   EKG Interpretation None      MDM   Final diagnoses:  Dental caries    Symptoms are consistent with a tooth infection. There are no obvious signs of a severe abscess. He has no significant facial swelling. At this time there does not appear to be any evidence of an emergency medical condition. I will  refer him to a dentist/oral surgeon. Patient will be given prescriptions  for pain medications and antibiotics.      [place] Dibbles, MD 02/01/14 [phone]

## 2014-02-01 NOTE — ED Notes (Signed)
The pt has had a toothache and swelling rt jaw for 2 dayts.

## 2014-02-01 NOTE — Discharge Instructions (Signed)
Dental Caries °Dental caries is tooth decay. This decay can cause a hole in teeth (cavity) that can get bigger and deeper over time. °HOME CARE °· Brush and floss your teeth. Do this at least two times a day. °· Use a fluoride toothpaste. °· Use a mouth rinse if told by your dentist or doctor. °· Eat less sugary and starchy foods. Drink less sugary drinks. °· Avoid snacking often on sugary and starchy foods. Avoid sipping often on sugary drinks. °· Keep regular checkups and cleanings with your dentist. °· Use fluoride supplements if told by your dentist or doctor. °· Allow fluoride to be applied to teeth if told by your dentist or doctor. °Document Released: 09/29/2007 Document Revised: 05/06/2013 Document Reviewed: 12/23/2011 °ExitCare® Patient Information ©2015 [employer]. This information is not intended to replace advice given to you by your health care provider. Make sure you discuss any questions you have with your health care provider. ° °

## 2014-02-01 NOTE — ED Notes (Signed)
Pt reports last night her 2nd to last molar on rt side broke off. Pt has had an issue with the same tooth for a while and has an appt to have the tooth removed on the 15th of Feb but her face began to swell and she began to have increased pain and could not eat. Pt rates pain 7/10.

## 2014-09-17 ENCOUNTER — Emergency Department (HOSPITAL_BASED_OUTPATIENT_CLINIC_OR_DEPARTMENT_OTHER)
Admission: EM | Admit: 2014-09-17 | Discharge: 2014-09-17 | Disposition: A | Discharge status: Home / Self Care | Payer: Medicaid Other | Attending: Emergency Medicine | Admitting: Emergency Medicine

## 2014-09-17 ENCOUNTER — Encounter (HOSPITAL_BASED_OUTPATIENT_CLINIC_OR_DEPARTMENT_OTHER): Payer: Self-pay

## 2014-09-17 DIAGNOSIS — Y998 Other external cause status: Secondary | ICD-10-CM | POA: Insufficient documentation

## 2014-09-17 DIAGNOSIS — Z720 Tobacco use: Secondary | ICD-10-CM | POA: Diagnosis not present

## 2014-09-17 DIAGNOSIS — Y9389 Activity, other specified: Secondary | ICD-10-CM | POA: Insufficient documentation

## 2014-09-17 DIAGNOSIS — S20229A Contusion of unspecified back wall of thorax, initial encounter: Secondary | ICD-10-CM | POA: Diagnosis not present

## 2014-09-17 DIAGNOSIS — Z79899 Other long term (current) drug therapy: Secondary | ICD-10-CM | POA: Insufficient documentation

## 2014-09-17 DIAGNOSIS — S29012A Strain of muscle and tendon of back wall of thorax, initial encounter: Secondary | ICD-10-CM | POA: Diagnosis not present

## 2014-09-17 DIAGNOSIS — S29002A Unspecified injury of muscle and tendon of back wall of thorax, initial encounter: Secondary | ICD-10-CM | POA: Diagnosis present

## 2014-09-17 DIAGNOSIS — Y92410 Unspecified street and highway as the place of occurrence of the external cause: Secondary | ICD-10-CM | POA: Diagnosis not present

## 2014-09-17 DIAGNOSIS — S199XXA Unspecified injury of neck, initial encounter: Secondary | ICD-10-CM | POA: Diagnosis not present

## 2014-09-17 DIAGNOSIS — Z792 Long term (current) use of antibiotics: Secondary | ICD-10-CM | POA: Diagnosis not present

## 2014-09-17 DIAGNOSIS — S39012A Strain of muscle, fascia and tendon of lower back, initial encounter: Secondary | ICD-10-CM

## 2014-09-17 DIAGNOSIS — S2020XA Contusion of thorax, unspecified, initial encounter: Secondary | ICD-10-CM

## 2014-09-17 MED ORDER — ORPHENADRINE CITRATE ER 100 MG PO TB12: 100.0000 mg | ORAL_TABLET | Freq: Two times a day (BID) | ORAL | Status: DC

## 2014-09-17 MED ORDER — IBUPROFEN 600 MG PO TABS: 600.0000 mg | ORAL_TABLET | Freq: Four times a day (QID) | ORAL | Status: DC | PRN

## 2014-09-17 NOTE — ED Notes (Signed)
MD at bedside. 
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
## 2015-01-29 IMAGING — US US PELVIS COMPLETE
1 series · 13 of 25 positions shown · non-contrast
Comparison: Pelvic CT 03/20/2008

CLINICAL DATA: Left lower quadrant pain.

EXAM:
TRANSABDOMINAL AND TRANSVAGINAL ULTRASOUND OF PELVIS
DOPPLER ULTRASOUND OF OVARIES
TECHNIQUE: Both transabdominal and transvaginal ultrasound examinations of the
pelvis were performed. Transabdominal technique was performed for
global imaging of the pelvis including uterus, ovaries, adnexal
regions, and pelvic cul-de-sac.
It was necessary to proceed with endovaginal exam following the
transabdominal exam to visualize the uterus, endometrium, and
ovaries. Color and duplex Doppler ultrasound was utilized to
evaluate blood flow to the ovaries.

[Series 1: us pelvis complete · 0.20mm/px · 13 of 54 slices shown]
[im 1/54]
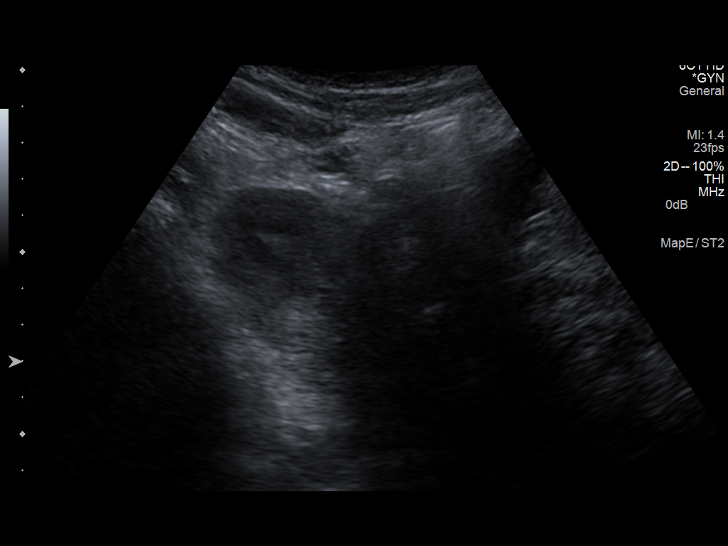
[im 5/54]
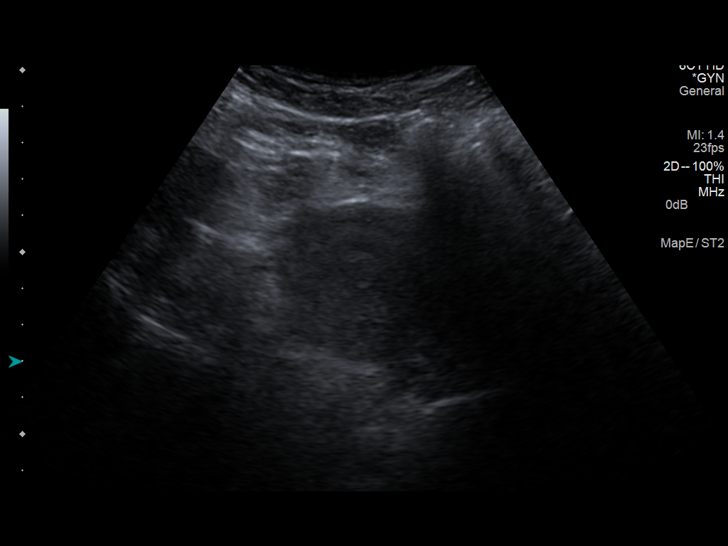
[im 9/54]
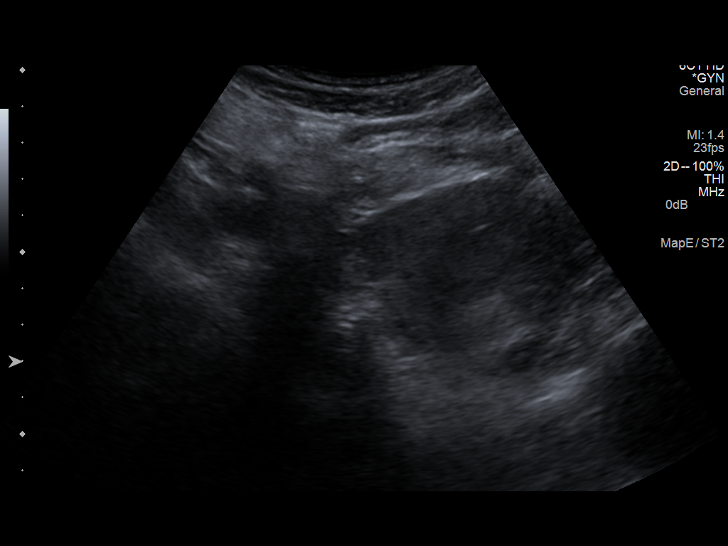
[im 14/54]
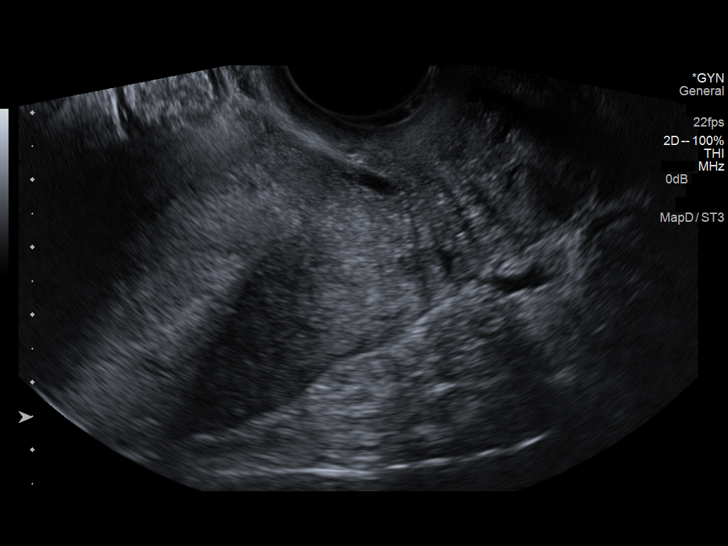
[im 18/54]
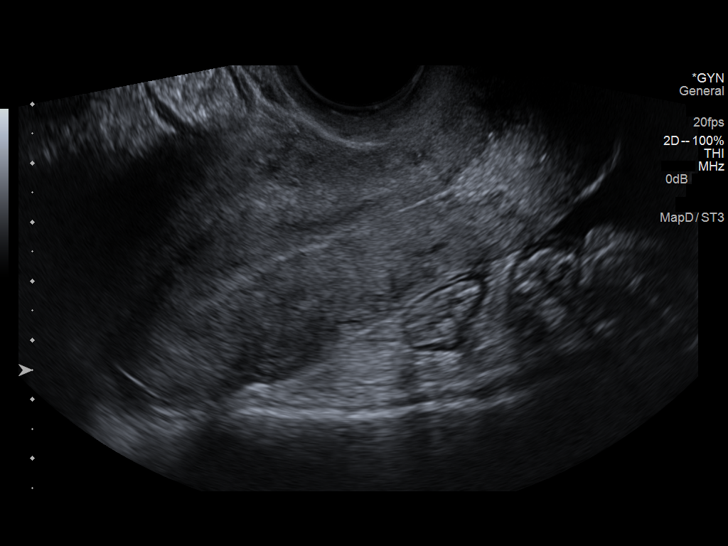
[im 23/54]
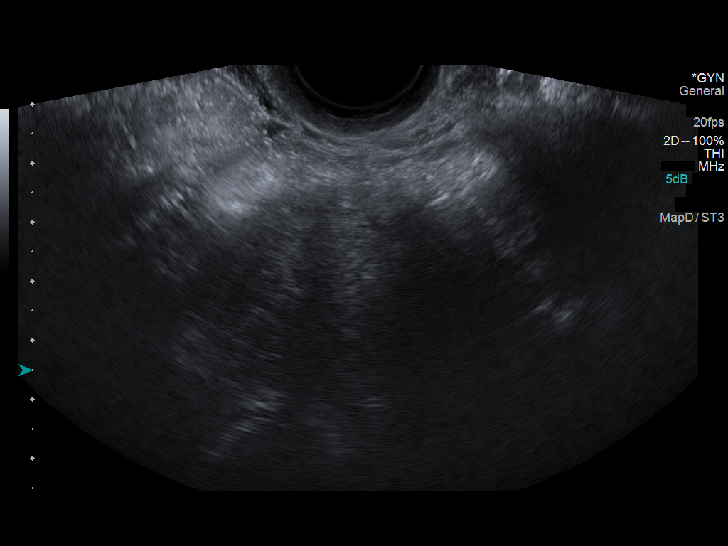
[im 27/54]
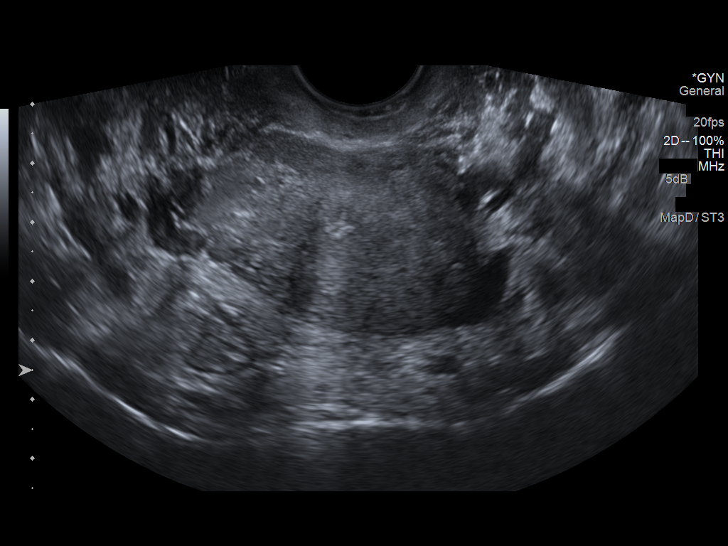
[im 31/54]
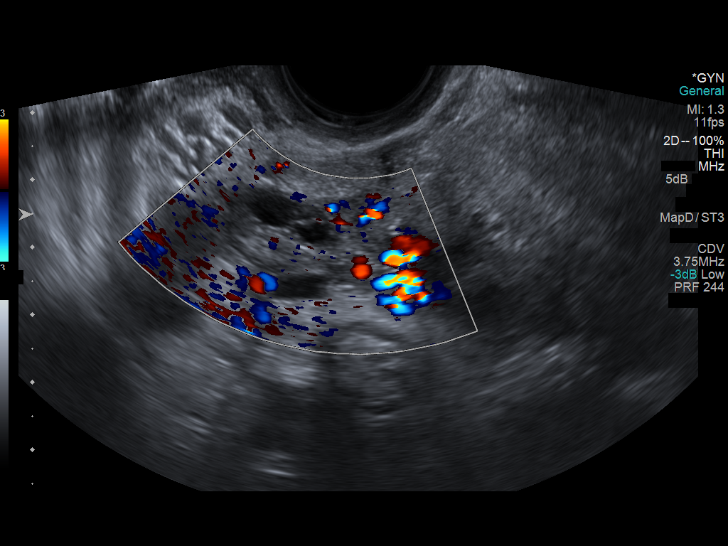
[im 36/54]
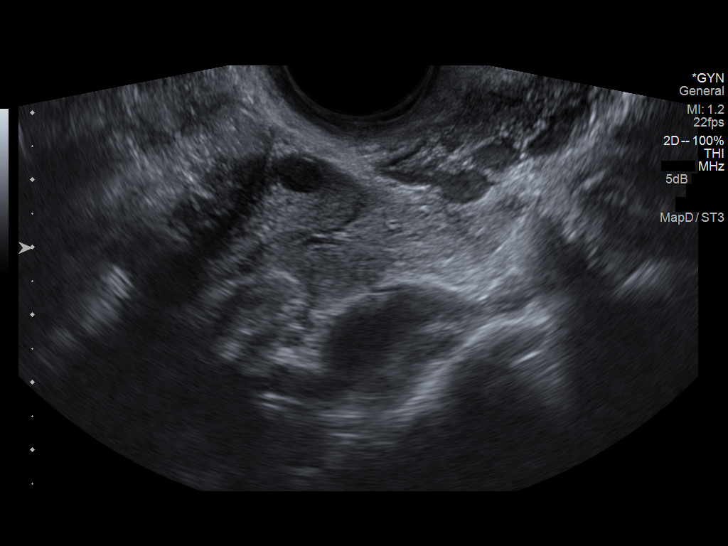
[im 40/54]
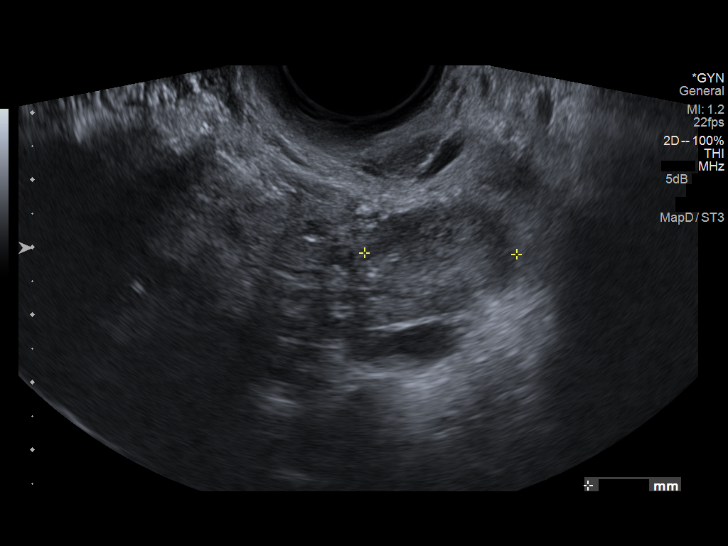
[im 45/54]
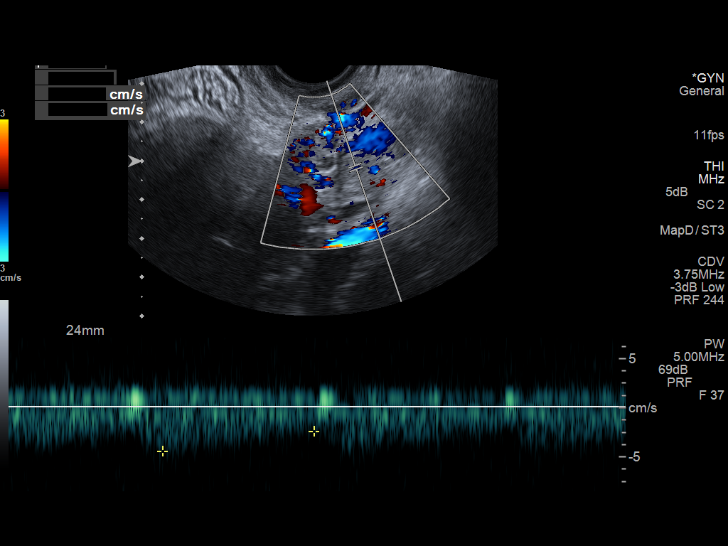
[im 49/54]
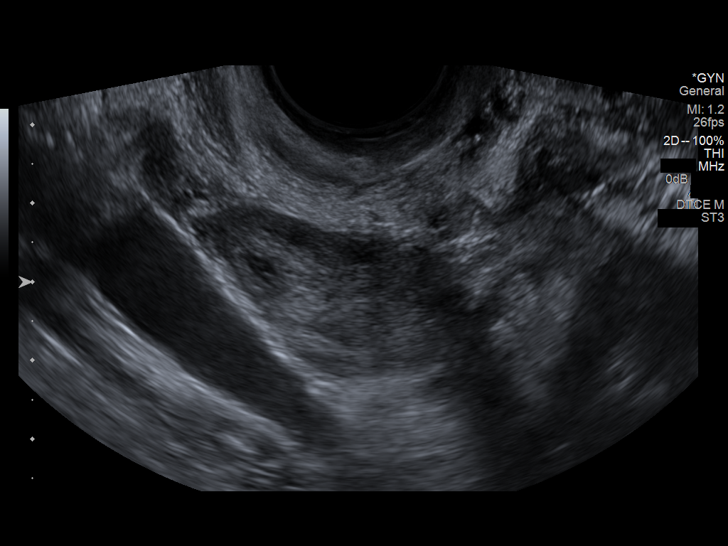
[im 54/54]
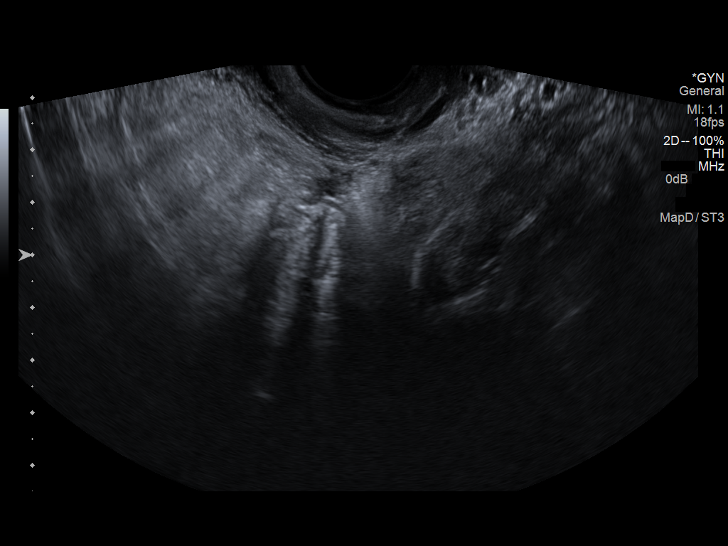

[13 of 25 positions shown; findings below may reference images not displayed]

FINDINGS: Uterus

Measurements: 9.7 x 4.3 x 5.2 cm. Echotexture is minimally
heterogeneous without fibroid or other focal lesion identified. A
small amount of fluid was noted in the cervical canal.

Endometrium

Thickness: 10.6 mm.  No focal abnormality visualized.

Right ovary

Measurements: 2.9 x 1.3 x 1.8 cm. Normal appearance/no adnexal mass.

Left ovary

Measurements: 3.8 x 1.9 x 2.3 cm. Normal appearance/no adnexal mass.

Pulsed Doppler evaluation of both ovaries demonstrates normal
low-resistance arterial and venous waveforms.

Other findings

Trace free fluid in the pelvis.
IMPRESSION: 1. Unremarkable appearance of the ovaries. No evidence of torsion
during this examination.
2. Trace free fluid in the pelvis.

## 2015-10-05 ENCOUNTER — Encounter (HOSPITAL_BASED_OUTPATIENT_CLINIC_OR_DEPARTMENT_OTHER): Payer: Self-pay | Admitting: Emergency Medicine

## 2015-10-05 ENCOUNTER — Emergency Department (HOSPITAL_BASED_OUTPATIENT_CLINIC_OR_DEPARTMENT_OTHER)
Admission: EM | Admit: 2015-10-05 | Discharge: 2015-10-05 | Disposition: A | Discharge status: Home / Self Care | Payer: Medicaid Other | Attending: Emergency Medicine | Admitting: Emergency Medicine

## 2015-10-05 DIAGNOSIS — F17210 Nicotine dependence, cigarettes, uncomplicated: Secondary | ICD-10-CM | POA: Insufficient documentation

## 2015-10-05 DIAGNOSIS — N644 Mastodynia: Secondary | ICD-10-CM | POA: Diagnosis present

## 2015-10-05 DIAGNOSIS — Z79899 Other long term (current) drug therapy: Secondary | ICD-10-CM | POA: Insufficient documentation

## 2015-10-05 HISTORY — DX: Opioid abuse, in remission: F11.11

## 2015-10-05 LAB — PREGNANCY, URINE: Preg Test, Ur: NEGATIVE

## 2015-10-05 NOTE — ED Triage Notes (Signed)
Pt having bilateral breast pain for over one month.  Pt has noticed some inflammation in breast tissue intermittently.  No fever, no drainage.  Does have left sided nipple piercing.

## 2015-10-05 NOTE — Discharge Instructions (Signed)
Try aleve for pain daily. Try some warm compresses. Wear supportive bra. Follow up with OB/GYN

## 2015-10-05 NOTE — ED Provider Notes (Signed)
[facility]   CSN: [id number] Arrival date & time: 10/05/15  1032     History   Chief Complaint Chief Complaint  Patient presents with  . Breast Pain    HPI Katie Glover is a 30 y.o. female.  HPI Katie Glover is a 30 y.o. female presents to emergency department complaining of bilateral breast pain. Patient states that the pain started a month and a half ago. She states she has had a period since then and reports that the pain has not changed during the course of her menstrual cycle. She reports pain is in bilateral breasts. She feels like her breasts are swollen. She denies any discharge from the nipple. She denies any injuries. She denies feeling any masses. She denies pregnancy. No history of the same. She has not tried any medications for this.  Past Medical History:  Diagnosis Date  . Narcotic abuse in remission     Patient Active Problem List   Diagnosis Date Noted  . Acute cholecystitis 11/19/2010    Past Surgical History:  Procedure Laterality Date  . CHOLECYSTECTOMY  11/19/2010   Procedure: LAPAROSCOPIC CHOLECYSTECTOMY;  Surgeon: Shelly Rubensteinouglas A Blackman, MD;  Location: [facility] OR;  Service: General;  Laterality: N/A;  . TUBAL LIGATION      OB History    No data available       Home Medications    Prior to Admission medications   Medication Sig Start Date End Date Taking? Authorizing Provider  methadone (DOLOPHINE) 10 MG tablet Take 10 mg by mouth daily.   Yes Historical Provider, MD  ibuprofen (ADVIL,MOTRIN) 600 MG tablet Take 1 tablet (600 mg total) by mouth every 6 (six) hours as needed. 09/17/14   Arby BarretteMarcy Pfeiffer, MD    Family History Family History  Problem Relation Age of Onset  . Kidney cancer    . Lung cancer      Social History Social History  Substance Use Topics  . Smoking status: Current Every Day Smoker    Packs/day: 0.50    Years: 9.00    Types: Cigarettes  . Smokeless tobacco: Never Used  . Alcohol  use Yes     Comment: OCCASIONAL     Allergies   Review of patient's allergies indicates no known allergies.   Review of Systems Review of Systems  Constitutional: Negative for chills and fever.  Respiratory: Negative for cough, chest tightness and shortness of breath.   Cardiovascular: Positive for chest pain. Negative for palpitations and leg swelling.  Gastrointestinal: Negative for abdominal pain, diarrhea, nausea and vomiting.  Genitourinary: Negative for dysuria, flank pain, pelvic pain, vaginal bleeding, vaginal discharge and vaginal pain.  Musculoskeletal: Negative for arthralgias, myalgias, neck pain and neck stiffness.  Skin: Negative for rash.  Neurological: Negative for dizziness, weakness and headaches.  All other systems reviewed and are negative.    Physical Exam Updated Vital Signs BP 112/77 (BP Location: Right Arm)   Pulse 88   Temp 97.8 F (36.6 C) (Oral)   Resp 18   Ht 5\' 2"  (1.575 m)   Wt 54.4 kg   LMP 09/14/2015   SpO2 99%   BMI 21.95 kg/m   Physical Exam  Constitutional: She appears well-developed and well-nourished. No distress.  HENT:  Head: Normocephalic.  Eyes: Conjunctivae are normal.  Neck: Neck supple.  Cardiovascular: Normal rate, regular rhythm and normal heart sounds.   Pulmonary/Chest: Effort normal and breath sounds normal. No respiratory distress. She has no wheezes. She  has no rales. She exhibits tenderness.  Normal breasts bilaterally. Piercing in the left nipple. No nipple discharge. No masses palpated. No discoloration or skin changes noted over the breast. No axillary lymphadenopathy bilaterally.  Musculoskeletal: She exhibits no edema.  Neurological: She is alert.  Skin: Skin is warm and dry.  Psychiatric: She has a normal mood and affect. Her behavior is normal.  Nursing note and vitals reviewed.    ED Treatments / Results  Labs (all labs ordered are listed, but only abnormal results are displayed) Labs Reviewed    PREGNANCY, URINE    EKG  EKG Interpretation None       Radiology No results found.  Procedures Procedures (including critical care time)  Medications Ordered in ED Medications - No data to display   Initial Impression / Assessment and Plan / ED Course  I have reviewed the triage vital signs and the nursing notes.  Pertinent labs & imaging results that were available during my care of the patient were reviewed by me and considered in my medical decision making (see chart for details).  Clinical Course    Patient in emergency department bilateral breast pain and tenderness. Exam unremarkable. Specifically no masses palpated, no nipple discharge, no evidence of any infectious processes. Patient is not breast-feeding. Will check pregnancy test.  12:02 PM Urine preg test neg. Home with OB/GYN follow up  Final Clinical Impressions(s) / ED Diagnoses   Final diagnoses:  Breast pain in female    New Prescriptions New Prescriptions   No medications on file     Jaynie Crumble, PA-C 10/05/15 1202    Gwyneth Sprout, MD 10/05/15 2047

## 2015-10-26 ENCOUNTER — Encounter: Payer: Medicaid Other | Admitting: Obstetrics and Gynecology

## 2015-10-26 ENCOUNTER — Encounter: Payer: Self-pay | Admitting: Obstetrics and Gynecology

## 2015-10-26 NOTE — Progress Notes (Signed)
Patient did not keep GYN appointment for 10/26/2015.  Katie Glover, Jr MD Attending [facility] (Faculty Practice)   

## 2018-11-14 ENCOUNTER — Other Ambulatory Visit: Payer: Self-pay

## 2018-11-14 ENCOUNTER — Ambulatory Visit (HOSPITAL_COMMUNITY)
Admission: EM | Admit: 2018-11-14 | Discharge: 2018-11-14 | Disposition: A | Discharge status: Home / Self Care | Payer: Medicaid Other | Attending: Urgent Care | Admitting: Urgent Care

## 2018-11-14 ENCOUNTER — Encounter (HOSPITAL_COMMUNITY): Payer: Self-pay

## 2018-11-14 DIAGNOSIS — K5900 Constipation, unspecified: Secondary | ICD-10-CM

## 2018-11-14 DIAGNOSIS — R350 Frequency of micturition: Secondary | ICD-10-CM | POA: Diagnosis present

## 2018-11-14 DIAGNOSIS — R8290 Unspecified abnormal findings in urine: Secondary | ICD-10-CM | POA: Diagnosis present

## 2018-11-14 LAB — POCT URINALYSIS DIP (DEVICE)
Bilirubin Urine: NEGATIVE
Glucose, UA: NEGATIVE mg/dL
Hgb urine dipstick: NEGATIVE
Nitrite: NEGATIVE
Protein, ur: NEGATIVE mg/dL
Specific Gravity, Urine: 1.025 (ref 1.005–1.030)
Urobilinogen, UA: 0.2 mg/dL (ref 0.0–1.0)
pH: 6 (ref 5.0–8.0)

## 2018-11-14 MED ORDER — POLYETHYLENE GLYCOL 3350 17 GM/SCOOP PO POWD: 1.0000 | Freq: Once | ORAL | 0 refills | Status: AC

## 2018-11-14 MED ORDER — NITROFURANTOIN MONOHYD MACRO 100 MG PO CAPS: 100.0000 mg | ORAL_CAPSULE | Freq: Two times a day (BID) | ORAL | 0 refills | Status: DC

## 2018-11-14 NOTE — ED Provider Notes (Signed)
[place]    CSN: [id number] Arrival date & time: 11/14/18  1219      History   Chief Complaint Chief Complaint  Patient presents with  . Urinary Tract Infection    HPI Katie Glover is a 33 y.o. female.   Patient is a 33 year old female the presents today with malodorous urine, urinary frequency x1 week.  Symptoms have been constant, waxing waning.  She is also been slightly constipated with last bowel being approximate 3 days ago.  Recently started taking different vitamins to include iron vitamin and energy vitamin. No associated vaginal discharge, itching or irritation.  Was sexually active approximately a month and a half ago with condom use.  No fever, nausea or vomiting or flank pain.  ROS per HPI    Urinary Tract Infection   Past Medical History:  Diagnosis Date  . Narcotic abuse in remission [facility])     Patient Active Problem List   Diagnosis Date Noted  . Acute cholecystitis 11/19/2010    Past Surgical History:  Procedure Laterality Date  . CHOLECYSTECTOMY  11/19/2010   Procedure: LAPAROSCOPIC CHOLECYSTECTOMY;  Surgeon: Harl Bowie, MD;  Location: [place];  Service: General;  Laterality: N/A;  . TUBAL LIGATION      OB History   No obstetric history on file.      Home Medications    Prior to Admission medications   Medication Sig Start Date End Date Taking? Authorizing Provider  ibuprofen (ADVIL,MOTRIN) 600 MG tablet Take 1 tablet (600 mg total) by mouth every 6 (six) hours as needed. 09/17/14   Charlesetta Shanks, MD  methadone (DOLOPHINE) 10 MG tablet Take 10 mg by mouth daily.    [provider]  nitrofurantoin, macrocrystal-monohydrate, (MACROBID) 100 MG capsule Take 1 capsule (100 mg total) by mouth 2 (two) times daily. 11/14/18   Loura Halt A, NP  polyethylene glycol powder (GLYCOLAX/MIRALAX) 17 GM/SCOOP powder Take 255 g by mouth once for 1 dose. 1 scoop 2 x day until good Bowel movement. 11/14/18 11/14/18  Orvan July, NP    Family History Family History  Problem Relation Age of Onset  . Kidney cancer Other   . Lung cancer Other   . Healthy Mother   . Healthy Father     Social History Social History   Tobacco Use  . Smoking status: Current Every Day Smoker    Packs/day: 0.50    Years: 9.00    Pack years: 4.50    Types: Cigarettes  . Smokeless tobacco: Never Used  Substance Use Topics  . Alcohol use: Yes    Comment: OCCASIONAL  . Drug use: Yes    Types: Marijuana    Comment: pain medication, pt in recovery     Allergies   Patient has no known allergies.   Review of Systems Review of Systems   Physical Exam Triage Vital Signs ED Triage Vitals  Enc Vitals Group     BP 11/14/18 1252 103/67     Pulse Rate 11/14/18 1252 71     Resp 11/14/18 1252 16     Temp 11/14/18 1252 98.6 F (37 C)     Temp Source 11/14/18 1252 Oral     SpO2 11/14/18 1252 100 %     Weight --      Height --      Head Circumference --      Peak Flow --      Pain Score 11/14/18 1249 0  Pain Loc --      Pain Edu? --      Excl. in GC? --    No data found.  Updated Vital Signs BP 103/67 (BP Location: Right Arm)   Pulse 71   Temp 98.6 F (37 C) (Oral)   Resp 16   SpO2 100%   Visual Acuity Right Eye Distance:   Left Eye Distance:   Bilateral Distance:    Right Eye Near:   Left Eye Near:    Bilateral Near:     Physical Exam Vitals signs and nursing note reviewed.  Constitutional:      General: She is not in acute distress.    Appearance: Normal appearance. She is not ill-appearing, toxic-appearing or diaphoretic.  HENT:     Head: Normocephalic.     Nose: Nose normal.     Mouth/Throat:     Pharynx: Oropharynx is clear.  Eyes:     Conjunctiva/sclera: Conjunctivae normal.  Neck:     Musculoskeletal: Normal range of motion.  Pulmonary:     Effort: Pulmonary effort is normal.  Abdominal:     Palpations: Abdomen is soft.     Tenderness: There is no abdominal tenderness.   Musculoskeletal: Normal range of motion.  Skin:    General: Skin is warm and dry.     Findings: No rash.  Neurological:     Mental Status: She is alert.  Psychiatric:        Mood and Affect: Mood normal.      UC Treatments / Results  Labs (all labs ordered are listed, but only abnormal results are displayed) Labs Reviewed  POCT URINALYSIS DIP (DEVICE) - Abnormal; Notable for the following components:      Result Value   Ketones, ur TRACE (*)    Leukocytes,Ua SMALL (*)    All other components within normal limits  URINE CULTURE  CERVICOVAGINAL ANCILLARY ONLY    EKG   Radiology No results found.  Procedures Procedures (including critical care time)  Medications Ordered in UC Medications - No data to display  Initial Impression / Assessment and Plan / UC Course  I have reviewed the triage vital signs and the nursing notes.  Pertinent labs & imaging results that were available during my care of the patient were reviewed by me and considered in my medical decision making (see chart for details).     Urinary frequency-small leuks in urine.  Will send for culture. Will treat with Macrobid pending culture Swab sent for STD testing  Constipation-treating with MiraLAX Final Clinical Impressions(s) / UC Diagnoses   Final diagnoses:  Urinary frequency  Constipation, unspecified constipation type  Abnormal urine odor     Discharge Instructions     We will treat you for a urinary tract infection today based on your symptoms. We will also send this for culture Sending a swab for STD testing, BV and yeast infection. Make sure you are drinking plenty of fluids. MiraLAX for constipation Follow up as needed for continued or worsening symptoms     ED Prescriptions    Medication Sig Dispense Auth. Provider   polyethylene glycol powder (GLYCOLAX/MIRALAX) 17 GM/SCOOP powder Take 255 g by mouth once for 1 dose. 1 scoop 2 x day until good Bowel movement. 255 g Tallin Hart,  Lillyahna Hemberger A, NP   nitrofurantoin, macrocrystal-monohydrate, (MACROBID) 100 MG capsule Take 1 capsule (100 mg total) by mouth 2 (two) times daily. 10 capsule Dahlia ByesBast, Dessie Delcarlo A, NP     PDMP not reviewed  this encounter.   Janace Aris, NP 11/14/18 1354

## 2018-11-14 NOTE — Discharge Instructions (Signed)
We will treat you for a urinary tract infection today based on your symptoms. We will also send this for culture Sending a swab for STD testing, BV and yeast infection. Make sure you are drinking plenty of fluids. MiraLAX for constipation Follow up as needed for continued or worsening symptoms

## 2018-11-14 NOTE — ED Triage Notes (Signed)
Patient presents to Urgent Care with complaints of odorous urine since about a week ago. Patient reports she started taking several different vitamins and a coenzyme and wonders if it is related, pt denies urinary pain or frequency.

## 2018-11-15 LAB — URINE CULTURE: Culture: <10000 — AB

## 2018-11-16 ENCOUNTER — Telehealth: Payer: Self-pay | Admitting: Emergency Medicine

## 2018-11-16 LAB — CERVICOVAGINAL ANCILLARY ONLY
Bacterial vaginitis: POSITIVE — AB
Candida vaginitis: NEGATIVE
Chlamydia: NEGATIVE
Neisseria Gonorrhea: NEGATIVE
Trichomonas: NEGATIVE

## 2018-11-16 MED ORDER — METRONIDAZOLE 500 MG PO TABS: 500.0000 mg | ORAL_TABLET | Freq: Two times a day (BID) | ORAL | 0 refills | Status: AC

## 2018-11-16 NOTE — Telephone Encounter (Signed)
Bacterial vaginosis is positive. This was not treated at the urgent care visit.  Flagyl 500 mg BID x 7 days #14 no refills sent to patients pharmacy of choice.    Patient contacted and made aware of    results. Pt verbalized understanding and had all questions answered.   

## 2019-08-19 ENCOUNTER — Ambulatory Visit
Admission: EM | Admit: 2019-08-19 | Discharge: 2019-08-19 | Disposition: A | Discharge status: Home / Self Care | Payer: Medicaid Other | Attending: Physician Assistant | Admitting: Physician Assistant

## 2019-08-19 DIAGNOSIS — N3090 Cystitis, unspecified without hematuria: Secondary | ICD-10-CM | POA: Diagnosis present

## 2019-08-19 LAB — POCT URINALYSIS DIP (MANUAL ENTRY)
Glucose, UA: NEGATIVE mg/dL
Nitrite, UA: POSITIVE — AB
Protein Ur, POC: >=300 mg/dL — AB
Spec Grav, UA: >=1.03 — AB (ref 1.010–1.025)
Urobilinogen, UA: 0.2 E.U./dL
pH, UA: 6 (ref 5.0–8.0)

## 2019-08-19 MED ORDER — FLUCONAZOLE 150 MG PO TABS: 150.0000 mg | ORAL_TABLET | Freq: Every day | ORAL | 0 refills | Status: DC

## 2019-08-19 MED ORDER — CEPHALEXIN 500 MG PO CAPS: 500.0000 mg | ORAL_CAPSULE | Freq: Two times a day (BID) | ORAL | 0 refills | Status: DC

## 2019-08-19 NOTE — Discharge Instructions (Signed)
Your urine was positive for an urinary tract infection. Start keflex as directed. Keep hydrated, urine should be clear to pale yellow in color. Monitor for any worsening of symptoms, fever, worsening abdominal pain, nausea/vomiting, flank pain, follow up for reevaluation.  ° °

## 2019-08-19 NOTE — ED Triage Notes (Signed)
Pt c/o pain on urination, frequency, and rt flank pain x2 days. States same thing happen last Thursday except the flank pain and tx'd it with AZO and it cleared up.

## 2019-08-19 NOTE — ED Provider Notes (Signed)
[name]    CSN: [id number] Arrival date & time: 08/19/19  1502      History   Chief Complaint Chief Complaint  Patient presents with  . Urinary Tract Infection    HPI Katie Glover is a 34 y.o. female.   34 year old female comes in for 2 day history of urinary symptoms. Dysuria, frequency, right flank pain. Denies hematuria. Denies abdominal pain, nausea, vomiting. Denies fever, chills, flank/back pain. Denies vaginal discharge, itching, spotting. On depoprovera injections. S/p tubal ligation. States had similar symptoms last week that resolved with AZO.      Past Medical History:  Diagnosis Date  . Narcotic abuse in remission [facility])     Patient Active Problem List   Diagnosis Date Noted  . Acute cholecystitis 11/19/2010    Past Surgical History:  Procedure Laterality Date  . CHOLECYSTECTOMY  11/19/2010   Procedure: LAPAROSCOPIC CHOLECYSTECTOMY;  Surgeon: Shelly Rubenstein, MD;  Location: [facility];  Service: General;  Laterality: N/A;  . TUBAL LIGATION      OB History   No obstetric history on file.      Home Medications    Prior to Admission medications   Medication Sig Start Date End Date Taking? Authorizing Provider  cephALEXin (KEFLEX) 500 MG capsule Take 1 capsule (500 mg total) by mouth 2 (two) times daily. 08/19/19   Cathie Hoops, Nimco Bivens V, PA-C  fluconazole (DIFLUCAN) 150 MG tablet Take 1 tablet (150 mg total) by mouth daily. Take second dose 72 hours later if symptoms still persists. 08/19/19   Belinda Fisher, PA-C    Family History Family History  Problem Relation Age of Onset  . Kidney cancer Other   . Lung cancer Other   . Healthy Mother   . Healthy Father     Social History Social History   Tobacco Use  . Smoking status: Current Every Day Smoker    Packs/day: 0.50    Years: 9.00    Pack years: 4.50    Types: Cigarettes  . Smokeless tobacco: Never Used  Vaping Use  . Vaping Use: Never used  Substance Use Topics  . Alcohol use:  Yes    Comment: OCCASIONAL  . Drug use: Not Currently    Types: Marijuana    Comment: pain medication, pt in recovery     Allergies   Patient has no known allergies.   Review of Systems Review of Systems  Reason unable to perform ROS: See HPI as above.     Physical Exam Triage Vital Signs ED Triage Vitals  Enc Vitals Group     BP 08/19/19 1638 109/72     Pulse Rate 08/19/19 1638 71     Resp 08/19/19 1638 18     Temp 08/19/19 1638 98.3 F (36.8 C)     Temp Source 08/19/19 1638 Oral     SpO2 08/19/19 1638 99 %     Weight --      Height --      Head Circumference --      Peak Flow --      Pain Score 08/19/19 1639 4     Pain Loc --      Pain Edu? --      Excl. in GC? --    No data found.  Updated Vital Signs BP 109/72 (BP Location: Left Arm)   Pulse 71   Temp 98.3 F (36.8 C) (Oral)   Resp 18   SpO2 99%   Physical  Exam Constitutional:      General: She is not in acute distress.    Appearance: She is well-developed. She is not ill-appearing, toxic-appearing or diaphoretic.  HENT:     Head: Normocephalic and atraumatic.  Eyes:     Conjunctiva/sclera: Conjunctivae normal.     Pupils: Pupils are equal, round, and reactive to light.  Cardiovascular:     Rate and Rhythm: Normal rate and regular rhythm.  Pulmonary:     Effort: Pulmonary effort is normal. No respiratory distress.     Comments: LCTAB Abdominal:     General: Bowel sounds are normal.     Palpations: Abdomen is soft.     Tenderness: There is no abdominal tenderness. There is no right CVA tenderness, left CVA tenderness, guarding or rebound.  Musculoskeletal:     Cervical back: Normal range of motion and neck supple.  Skin:    General: Skin is warm and dry.  Neurological:     Mental Status: She is alert and oriented to person, place, and time.  Psychiatric:        Behavior: Behavior normal.        Judgment: Judgment normal.      UC Treatments / Results  Labs (all labs ordered are  listed, but only abnormal results are displayed) Labs Reviewed  POCT URINALYSIS DIP (MANUAL ENTRY) - Abnormal; Notable for the following components:      Result Value   Clarity, UA cloudy (*)    Bilirubin, UA small (*)    Ketones, POC UA trace (5) (*)    Spec Grav, UA >=1.030 (*)    Blood, UA large (*)    Protein Ur, POC >=300 (*)    Nitrite, UA Positive (*)    Leukocytes, UA Large (3+) (*)    All other components within normal limits  URINE CULTURE    EKG   Radiology No results found.  Procedures Procedures (including critical care time)  Medications Ordered in UC Medications - No data to display  Initial Impression / Assessment and Plan / UC Course  I have reviewed the triage vital signs and the nursing notes.  Pertinent labs & imaging results that were available during my care of the patient were reviewed by me and considered in my medical decision making (see chart for details).    Urine dipstick positive for UTI.  Patient with some right flank soreness, negative CVA tenderness, low suspicion for pyelonephritis, urethral stone.  Start keflex as directed. Push fluids. Return precautions given.  Final Clinical Impressions(s) / UC Diagnoses   Final diagnoses:  Cystitis    ED Prescriptions    Medication Sig Dispense Auth. Provider   cephALEXin (KEFLEX) 500 MG capsule Take 1 capsule (500 mg total) by mouth 2 (two) times daily. 10 capsule Ramonia Mcclaran V, PA-C   fluconazole (DIFLUCAN) 150 MG tablet Take 1 tablet (150 mg total) by mouth daily. Take second dose 72 hours later if symptoms still persists. 2 tablet Belinda Fisher, PA-C     PDMP not reviewed this encounter.   Belinda Fisher, PA-C 08/19/19 1723

## 2019-08-22 LAB — URINE CULTURE: Culture: >=100000 — AB

## 2019-11-18 ENCOUNTER — Encounter (HOSPITAL_BASED_OUTPATIENT_CLINIC_OR_DEPARTMENT_OTHER): Payer: Self-pay | Admitting: *Deleted

## 2019-11-18 ENCOUNTER — Emergency Department (HOSPITAL_BASED_OUTPATIENT_CLINIC_OR_DEPARTMENT_OTHER)
Admission: EM | Admit: 2019-11-18 | Discharge: 2019-11-18 | Disposition: A | Discharge status: Home / Self Care | Payer: Medicaid Other | Source: Home / Self Care | Attending: Emergency Medicine | Admitting: Emergency Medicine

## 2019-11-18 ENCOUNTER — Other Ambulatory Visit: Payer: Self-pay

## 2019-11-18 DIAGNOSIS — F17210 Nicotine dependence, cigarettes, uncomplicated: Secondary | ICD-10-CM | POA: Insufficient documentation

## 2019-11-18 DIAGNOSIS — N10 Acute tubulo-interstitial nephritis: Secondary | ICD-10-CM

## 2019-11-18 DIAGNOSIS — Z9851 Tubal ligation status: Secondary | ICD-10-CM | POA: Insufficient documentation

## 2019-11-18 LAB — CBC WITH DIFFERENTIAL/PLATELET
Abs Immature Granulocytes: 0.11 10*3/uL — ABNORMAL HIGH (ref 0.00–0.07)
Basophils Absolute: 0 10*3/uL (ref 0.0–0.1)
Basophils Relative: 0 %
Eosinophils Absolute: 0 10*3/uL (ref 0.0–0.5)
Eosinophils Relative: 0 %
HCT: 35.1 % — ABNORMAL LOW (ref 36.0–46.0)
Hemoglobin: 11.9 g/dL — ABNORMAL LOW (ref 12.0–15.0)
Immature Granulocytes: 1 %
Lymphocytes Relative: 4 %
Lymphs Abs: 0.8 10*3/uL (ref 0.7–4.0)
MCH: 31.1 pg (ref 26.0–34.0)
MCHC: 33.9 g/dL (ref 30.0–36.0)
MCV: 91.6 fL (ref 80.0–100.0)
Monocytes Absolute: 1.4 10*3/uL — ABNORMAL HIGH (ref 0.1–1.0)
Monocytes Relative: 7 %
Neutro Abs: 17.5 10*3/uL — ABNORMAL HIGH (ref 1.7–7.7)
Neutrophils Relative %: 88 %
Platelets: 206 10*3/uL (ref 150–400)
RBC: 3.83 MIL/uL — ABNORMAL LOW (ref 3.87–5.11)
RDW: 12.7 % (ref 11.5–15.5)
WBC: 19.9 10*3/uL — ABNORMAL HIGH (ref 4.0–10.5)
nRBC: 0 % (ref 0.0–0.2)

## 2019-11-18 LAB — URINALYSIS, MICROSCOPIC (REFLEX): WBC, UA: >50 WBC/hpf (ref 0–5)

## 2019-11-18 LAB — COMPREHENSIVE METABOLIC PANEL
ALT: 20 U/L (ref 0–44)
AST: 25 U/L (ref 15–41)
Albumin: 3.7 g/dL (ref 3.5–5.0)
Alkaline Phosphatase: 73 U/L (ref 38–126)
Anion gap: 10 (ref 5–15)
BUN: 9 mg/dL (ref 6–20)
CO2: 25 mmol/L (ref 22–32)
Calcium: 8.9 mg/dL (ref 8.9–10.3)
Chloride: 100 mmol/L (ref 98–111)
Creatinine, Ser: 0.73 mg/dL (ref 0.44–1.00)
GFR, Estimated: >60 mL/min (ref >60)
Glucose, Bld: 117 mg/dL — ABNORMAL HIGH (ref 70–99)
Potassium: 3.3 mmol/L — ABNORMAL LOW (ref 3.5–5.1)
Sodium: 135 mmol/L (ref 135–145)
Total Bilirubin: 0.9 mg/dL (ref 0.3–1.2)
Total Protein: 7.5 g/dL (ref 6.5–8.1)

## 2019-11-18 LAB — URINALYSIS, ROUTINE W REFLEX MICROSCOPIC
Bilirubin Urine: NEGATIVE
Glucose, UA: NEGATIVE mg/dL
Ketones, ur: >80 mg/dL — AB
Nitrite: POSITIVE — AB
Protein, ur: 30 mg/dL — AB
Specific Gravity, Urine: 1.02 (ref 1.005–1.030)
pH: 6 (ref 5.0–8.0)

## 2019-11-18 LAB — PREGNANCY, URINE: Preg Test, Ur: NEGATIVE

## 2019-11-18 MED ORDER — ONDANSETRON HCL 4 MG/2ML IJ SOLN
4.0000 mg | Freq: Once | INTRAMUSCULAR | Status: AC
  Administered 2019-11-18: 4 mg via INTRAVENOUS
  Filled 2019-11-18: qty 2

## 2019-11-18 MED ORDER — SODIUM CHLORIDE 0.9 % IV BOLUS
1000.0000 mL | Freq: Once | INTRAVENOUS | Status: AC
  Administered 2019-11-18: 1000 mL via INTRAVENOUS

## 2019-11-18 MED ORDER — CEPHALEXIN 500 MG PO CAPS: 500.0000 mg | ORAL_CAPSULE | Freq: Two times a day (BID) | ORAL | 0 refills | Status: DC

## 2019-11-18 MED ORDER — SODIUM CHLORIDE 0.9 % IV SOLN
2.0000 g | Freq: Once | INTRAVENOUS | Status: AC
  Administered 2019-11-18: 2 g via INTRAVENOUS
  Filled 2019-11-18: qty 20

## 2019-11-18 MED ORDER — SODIUM CHLORIDE 0.9 % IV SOLN
INTRAVENOUS | Status: DC | PRN
  Administered 2019-11-18: 250 mL via INTRAVENOUS

## 2019-11-18 MED ORDER — ONDANSETRON HCL 4 MG PO TABS: 4.0000 mg | ORAL_TABLET | Freq: Four times a day (QID) | ORAL | 0 refills | Status: DC

## 2019-11-18 NOTE — ED Provider Notes (Signed)
[facility] EMERGENCY DEPARTMENT Provider Note   CSN: [id number] Arrival date & time: 11/18/19  1845     History Chief Complaint  Patient presents with  . Abdominal Pain  . Fever    Katie Glover is a 34 y.o. female.  The history is provided by the patient.  Fever Temp source:  Oral Severity:  Mild Onset quality:  Gradual Duration:  2 days Timing:  Intermittent Progression:  Waxing and waning Chronicity:  New Relieved by:  Nothing Worsened by:  Nothing Associated symptoms: dysuria (right flank pain)   Associated symptoms: no chest pain, no chills, no cough, no ear pain, no rash, no sore throat and no vomiting   Risk factors: no recent sickness and no sick contacts        Past Medical History:  Diagnosis Date  . Narcotic abuse in remission [facility])     Patient Active Problem List   Diagnosis Date Noted  . Acute cholecystitis 11/19/2010    Past Surgical History:  Procedure Laterality Date  . CHOLECYSTECTOMY  11/19/2010   Procedure: LAPAROSCOPIC CHOLECYSTECTOMY;  Surgeon: Shelly Rubenstein, MD;  Location: [facility] OR;  Service: General;  Laterality: N/A;  . TUBAL LIGATION       OB History   No obstetric history on file.     Family History  Problem Relation Age of Onset  . Kidney cancer Other   . Lung cancer Other   . Healthy Mother   . Healthy Father     Social History   Tobacco Use  . Smoking status: Current Every Day Smoker    Packs/day: 0.50    Years: 9.00    Pack years: 4.50    Types: Cigarettes  . Smokeless tobacco: Never Used  Vaping Use  . Vaping Use: Never used  Substance Use Topics  . Alcohol use: Yes    Comment: OCCASIONAL  . Drug use: Not Currently    Types: Marijuana    Comment: pain medication, pt in recovery    Home Medications Prior to Admission medications   Medication Sig Start Date End Date Taking? Authorizing Provider  cephALEXin (KEFLEX) 500 MG capsule Take 1 capsule (500 mg total) by mouth 2 (two) times  daily for 10 days. 11/18/19 11/28/19  Eveline Sauve, DO  fluconazole (DIFLUCAN) 150 MG tablet Take 1 tablet (150 mg total) by mouth daily. Take second dose 72 hours later if symptoms still persists. 08/19/19   Cathie Hoops, Amy V, PA-C  ondansetron (ZOFRAN) 4 MG tablet Take 1 tablet (4 mg total) by mouth every 6 (six) hours for 20 doses. 11/18/19 11/23/19  Virgina Norfolk, DO    Allergies    Patient has no known allergies.  Review of Systems   Review of Systems  Constitutional: Positive for fever. Negative for chills.  HENT: Negative for ear pain and sore throat.   Eyes: Negative for pain and visual disturbance.  Respiratory: Negative for cough and shortness of breath.   Cardiovascular: Negative for chest pain and palpitations.  Gastrointestinal: Negative for abdominal pain and vomiting.  Genitourinary: Positive for dysuria (right flank pain) and flank pain. Negative for hematuria, menstrual problem, vaginal bleeding and vaginal pain.  Musculoskeletal: Negative for arthralgias and back pain.  Skin: Negative for color change and rash.  Neurological: Negative for seizures and syncope.  All other systems reviewed and are negative.   Physical Exam Updated Vital Signs BP 108/63 (BP Location: Left Arm)   Pulse 78   Temp 98.8 F (37.1  C) (Oral)   Resp 14   Ht 5\' 2"  (1.575 m)   Wt 54.4 kg   SpO2 100%   BMI 21.95 kg/m   Physical Exam Vitals and nursing note reviewed.  Constitutional:      General: She is not in acute distress.    Appearance: She is well-developed. She is not ill-appearing.  HENT:     Head: Normocephalic and atraumatic.     Mouth/Throat:     Mouth: Mucous membranes are moist.  Eyes:     Extraocular Movements: Extraocular movements intact.     Conjunctiva/sclera: Conjunctivae normal.  Cardiovascular:     Rate and Rhythm: Normal rate and regular rhythm.     Heart sounds: Normal heart sounds. No murmur heard.   Pulmonary:     Effort: Pulmonary effort is normal. No  respiratory distress.     Breath sounds: Normal breath sounds.  Abdominal:     Palpations: Abdomen is soft.     Tenderness: There is no abdominal tenderness. There is right CVA tenderness. There is no left CVA tenderness, guarding or rebound. Negative signs include Murphy's sign, Rovsing's sign, McBurney's sign and psoas sign.  Musculoskeletal:     Cervical back: Neck supple.  Skin:    General: Skin is warm and dry.     Capillary Refill: Capillary refill takes less than 2 seconds.  Neurological:     General: No focal deficit present.     Mental Status: She is alert.     ED Results / Procedures / Treatments   Labs (all labs ordered are listed, but only abnormal results are displayed) Labs Reviewed  URINALYSIS, ROUTINE W REFLEX MICROSCOPIC - Abnormal; Notable for the following components:      Result Value   APPearance CLOUDY (*)    Hgb urine dipstick MODERATE (*)    Ketones, ur >80 (*)    Protein, ur 30 (*)    Nitrite POSITIVE (*)    Leukocytes,Ua LARGE (*)    All other components within normal limits  CBC WITH DIFFERENTIAL/PLATELET - Abnormal; Notable for the following components:   WBC 19.9 (*)    RBC 3.83 (*)    Hemoglobin 11.9 (*)    HCT 35.1 (*)    Neutro Abs 17.5 (*)    Monocytes Absolute 1.4 (*)    Abs Immature Granulocytes 0.11 (*)    All other components within normal limits  COMPREHENSIVE METABOLIC PANEL - Abnormal; Notable for the following components:   Potassium 3.3 (*)    Glucose, Bld 117 (*)    All other components within normal limits  URINALYSIS, MICROSCOPIC (REFLEX) - Abnormal; Notable for the following components:   Bacteria, UA MANY (*)    Non Squamous Epithelial PRESENT (*)    All other components within normal limits  URINE CULTURE  PREGNANCY, URINE    EKG None  Radiology No results found.  Procedures Procedures (including critical care time)  Medications Ordered in ED Medications  0.9 %  sodium chloride infusion ( Intravenous Stopped  11/18/19 2034)  ondansetron (ZOFRAN) injection 4 mg (4 mg Intravenous Given 11/18/19 1928)  sodium chloride 0.9 % bolus 1,000 mL (0 mLs Intravenous Stopped 11/18/19 2030)  cefTRIAXone (ROCEPHIN) 2 g in sodium chloride 0.9 % 100 mL IVPB (0 g Intravenous Stopped 11/18/19 2030)    ED Course  I have reviewed the triage vital signs and the nursing notes.  Pertinent labs & imaging results that were available during my care of the patient were reviewed  by me and considered in my medical decision making (see chart for details).    MDM Rules/Calculators/A&P                          Katie Glover is a 34 year old female with no significant medical history presents the ED with.  Patient with unremarkable vitals.  No fever.  States that she had a fever of 101 at home prior to arrival.  She took Motrin before coming here.  Feels like she has a urinary tract infection.  History of the same.  Has a foul-smelling odor to her urine.  Has some right CVA tenderness on exam.  No history of kidney stones.  Overall appears comfortable.  Urinalysis consistent with infection.  Did have a white count.  But overall does not appear to be septic.  Normal vitals otherwise.  Was given IV fluids, IV Zofran, IV Rocephin.  Will discharge with antibiotics and Zofran.  Understands return precautions and discharged in the ED in good condition.  No concern for appendicitis or kidney stones given H&P.  This chart was dictated using voice recognition software.  Despite best efforts to proofread,  errors can occur which can change the documentation meaning.     Final Clinical Impression(s) / ED Diagnoses Final diagnoses:  Acute pyelonephritis    Rx / DC Orders ED Discharge Orders         Ordered    cephALEXin (KEFLEX) 500 MG capsule  2 times daily        11/18/19 1949    ondansetron (ZOFRAN) 4 MG tablet  Every 6 hours        11/18/19 1949           Virgina Norfolk, DO 11/18/19 2047

## 2019-11-18 NOTE — Discharge Instructions (Signed)
Please return to the emergency department if symptoms worsen.  Suspect that you have a bladder and kidney infection.  You have been given your first dose of antibiotics today.  Continue antibiotics tomorrow morning.

## 2019-11-18 NOTE — ED Triage Notes (Signed)
Abdominal pain and fever x 3 days. Right mid quad pain. No appetite.

## 2019-11-19 ENCOUNTER — Encounter (HOSPITAL_BASED_OUTPATIENT_CLINIC_OR_DEPARTMENT_OTHER): Payer: Self-pay | Admitting: Emergency Medicine

## 2019-11-19 ENCOUNTER — Inpatient Hospital Stay (HOSPITAL_BASED_OUTPATIENT_CLINIC_OR_DEPARTMENT_OTHER)
Admission: EM | Admit: 2019-11-19 | Discharge: 2019-11-21 | DRG: 872 | Disposition: A | Discharge status: Home / Self Care | Payer: Medicaid Other | Attending: Internal Medicine | Admitting: Internal Medicine

## 2019-11-19 ENCOUNTER — Emergency Department (HOSPITAL_BASED_OUTPATIENT_CLINIC_OR_DEPARTMENT_OTHER): Payer: Medicaid Other

## 2019-11-19 DIAGNOSIS — N12 Tubulo-interstitial nephritis, not specified as acute or chronic: Secondary | ICD-10-CM | POA: Diagnosis not present

## 2019-11-19 DIAGNOSIS — R739 Hyperglycemia, unspecified: Secondary | ICD-10-CM | POA: Diagnosis present

## 2019-11-19 DIAGNOSIS — R509 Fever, unspecified: Secondary | ICD-10-CM | POA: Diagnosis present

## 2019-11-19 DIAGNOSIS — A4151 Sepsis due to Escherichia coli [E. coli]: Secondary | ICD-10-CM | POA: Diagnosis present

## 2019-11-19 DIAGNOSIS — Z20822 Contact with and (suspected) exposure to covid-19: Secondary | ICD-10-CM | POA: Diagnosis present

## 2019-11-19 DIAGNOSIS — D509 Iron deficiency anemia, unspecified: Secondary | ICD-10-CM | POA: Diagnosis not present

## 2019-11-19 DIAGNOSIS — F1111 Opioid abuse, in remission: Secondary | ICD-10-CM | POA: Diagnosis present

## 2019-11-19 DIAGNOSIS — G44209 Tension-type headache, unspecified, not intractable: Secondary | ICD-10-CM

## 2019-11-19 DIAGNOSIS — F17210 Nicotine dependence, cigarettes, uncomplicated: Secondary | ICD-10-CM | POA: Diagnosis present

## 2019-11-19 DIAGNOSIS — E871 Hypo-osmolality and hyponatremia: Secondary | ICD-10-CM | POA: Diagnosis present

## 2019-11-19 DIAGNOSIS — B001 Herpesviral vesicular dermatitis: Secondary | ICD-10-CM | POA: Diagnosis present

## 2019-11-19 DIAGNOSIS — B9620 Unspecified Escherichia coli [E. coli] as the cause of diseases classified elsewhere: Secondary | ICD-10-CM | POA: Diagnosis present

## 2019-11-19 DIAGNOSIS — N10 Acute tubulo-interstitial nephritis: Secondary | ICD-10-CM | POA: Diagnosis present

## 2019-11-19 DIAGNOSIS — E876 Hypokalemia: Secondary | ICD-10-CM

## 2019-11-19 DIAGNOSIS — D72829 Elevated white blood cell count, unspecified: Secondary | ICD-10-CM | POA: Diagnosis not present

## 2019-11-19 DIAGNOSIS — D649 Anemia, unspecified: Secondary | ICD-10-CM | POA: Diagnosis not present

## 2019-11-19 LAB — CBC WITH DIFFERENTIAL/PLATELET
Abs Immature Granulocytes: 0.15 10*3/uL — ABNORMAL HIGH (ref 0.00–0.07)
Basophils Absolute: 0 10*3/uL (ref 0.0–0.1)
Basophils Relative: 0 %
Eosinophils Absolute: 0 10*3/uL (ref 0.0–0.5)
Eosinophils Relative: 0 %
HCT: 32.4 % — ABNORMAL LOW (ref 36.0–46.0)
Hemoglobin: 11 g/dL — ABNORMAL LOW (ref 12.0–15.0)
Immature Granulocytes: 1 %
Lymphocytes Relative: 3 %
Lymphs Abs: 0.5 10*3/uL — ABNORMAL LOW (ref 0.7–4.0)
MCH: 31 pg (ref 26.0–34.0)
MCHC: 34 g/dL (ref 30.0–36.0)
MCV: 91.3 fL (ref 80.0–100.0)
Monocytes Absolute: 1.4 10*3/uL — ABNORMAL HIGH (ref 0.1–1.0)
Monocytes Relative: 7 %
Neutro Abs: 16.4 10*3/uL — ABNORMAL HIGH (ref 1.7–7.7)
Neutrophils Relative %: 89 %
Platelets: 173 10*3/uL (ref 150–400)
RBC: 3.55 MIL/uL — ABNORMAL LOW (ref 3.87–5.11)
RDW: 12.7 % (ref 11.5–15.5)
WBC: 18.4 10*3/uL — ABNORMAL HIGH (ref 4.0–10.5)
nRBC: 0 % (ref 0.0–0.2)

## 2019-11-19 LAB — RESPIRATORY PANEL BY RT PCR (FLU A&B, COVID)
Influenza A by PCR: NEGATIVE
Influenza B by PCR: NEGATIVE
SARS Coronavirus 2 by RT PCR: NEGATIVE

## 2019-11-19 LAB — COMPREHENSIVE METABOLIC PANEL
ALT: 19 U/L (ref 0–44)
AST: 22 U/L (ref 15–41)
Albumin: 3.3 g/dL — ABNORMAL LOW (ref 3.5–5.0)
Alkaline Phosphatase: 89 U/L (ref 38–126)
Anion gap: 8 (ref 5–15)
BUN: 8 mg/dL (ref 6–20)
CO2: 24 mmol/L (ref 22–32)
Calcium: 8.2 mg/dL — ABNORMAL LOW (ref 8.9–10.3)
Chloride: 99 mmol/L (ref 98–111)
Creatinine, Ser: 0.67 mg/dL (ref 0.44–1.00)
GFR, Estimated: >60 mL/min (ref >60)
Glucose, Bld: 98 mg/dL (ref 70–99)
Potassium: 2.9 mmol/L — ABNORMAL LOW (ref 3.5–5.1)
Sodium: 131 mmol/L — ABNORMAL LOW (ref 135–145)
Total Bilirubin: 0.8 mg/dL (ref 0.3–1.2)
Total Protein: 7.1 g/dL (ref 6.5–8.1)

## 2019-11-19 LAB — LACTIC ACID, PLASMA: Lactic Acid, Venous: 0.8 mmol/L (ref 0.5–1.9)

## 2019-11-19 MED ORDER — ACETAMINOPHEN 650 MG RE SUPP: 650.0000 mg | Freq: Four times a day (QID) | RECTAL | Status: DC | PRN

## 2019-11-19 MED ORDER — FENTANYL CITRATE (PF) 100 MCG/2ML IJ SOLN
50.0000 ug | Freq: Once | INTRAMUSCULAR | Status: AC
  Administered 2019-11-19: 50 ug via INTRAVENOUS
  Filled 2019-11-19: qty 2

## 2019-11-19 MED ORDER — KETOROLAC TROMETHAMINE 15 MG/ML IJ SOLN
15.0000 mg | Freq: Four times a day (QID) | INTRAMUSCULAR | Status: DC | PRN
  Administered 2019-11-19 – 2019-11-21 (×6): 15 mg via INTRAVENOUS
  Filled 2019-11-19 (×6): qty 1

## 2019-11-19 MED ORDER — ONDANSETRON HCL 4 MG/2ML IJ SOLN: 4.0000 mg | Freq: Four times a day (QID) | INTRAMUSCULAR | Status: DC | PRN

## 2019-11-19 MED ORDER — SODIUM CHLORIDE 0.9 % IV SOLN
INTRAVENOUS | Status: DC | PRN
  Administered 2019-11-19: 250 mL via INTRAVENOUS

## 2019-11-19 MED ORDER — POTASSIUM CHLORIDE CRYS ER 20 MEQ PO TBCR
40.0000 meq | EXTENDED_RELEASE_TABLET | Freq: Once | ORAL | Status: AC
  Administered 2019-11-19: 40 meq via ORAL
  Filled 2019-11-19 (×2): qty 2

## 2019-11-19 MED ORDER — SODIUM CHLORIDE 0.9 % IV SOLN
2.0000 g | INTRAVENOUS | Status: DC
  Administered 2019-11-19: 2 g via INTRAVENOUS
  Filled 2019-11-19: qty 2

## 2019-11-19 MED ORDER — ENOXAPARIN SODIUM 40 MG/0.4ML ~~LOC~~ SOLN
40.0000 mg | SUBCUTANEOUS | Status: DC
  Filled 2019-11-19: qty 0.4

## 2019-11-19 MED ORDER — ONDANSETRON HCL 4 MG PO TABS: 4.0000 mg | ORAL_TABLET | Freq: Four times a day (QID) | ORAL | Status: DC | PRN

## 2019-11-19 MED ORDER — LACTATED RINGERS IV BOLUS
1000.0000 mL | Freq: Once | INTRAVENOUS | Status: AC
  Administered 2019-11-19: 1000 mL via INTRAVENOUS

## 2019-11-19 MED ORDER — KETOROLAC TROMETHAMINE 30 MG/ML IJ SOLN
15.0000 mg | Freq: Once | INTRAMUSCULAR | Status: AC
  Administered 2019-11-19: 15 mg via INTRAVENOUS
  Filled 2019-11-19: qty 1

## 2019-11-19 MED ORDER — ONDANSETRON HCL 4 MG/2ML IJ SOLN
4.0000 mg | Freq: Once | INTRAMUSCULAR | Status: AC
  Administered 2019-11-19: 4 mg via INTRAVENOUS
  Filled 2019-11-19: qty 2

## 2019-11-19 MED ORDER — ACETAMINOPHEN 325 MG PO TABS
650.0000 mg | ORAL_TABLET | Freq: Four times a day (QID) | ORAL | Status: DC | PRN
  Administered 2019-11-19 – 2019-11-20 (×2): 650 mg via ORAL
  Filled 2019-11-19 (×2): qty 2

## 2019-11-19 NOTE — ED Notes (Signed)
Pt placed on monitor.  

## 2019-11-19 NOTE — ED Notes (Signed)
US at bedside

## 2019-11-19 NOTE — ED Notes (Signed)
Updated doug at [facility] regarding pt covid test and IV, monitor

## 2019-11-19 NOTE — H&P (Signed)
History and Physical        Hospital Admission Note Date: 11/19/2019  Patient name: Katie Glover Medical record number: [id number] Date of birth: 09-21-1985 Age: 34 y.o. Gender: female  PCP: System, Provider Not In    Chief Complaint    Chief Complaint  Patient presents with  . Flank Pain      HPI:   This is a 34 year old female with no significant past medical history presented to [facility] with flank pain and fever.  She was initially seen in the ED yesterday for fever of 101 F and symptoms of a UTI.  Noted to have right CVA tenderness yesterday.  She was treated with Rocephin and discharged on p.o. Keflex and Zofran however returned to the ED today with persistent fevers and uncontrolled pain.  She was transferred to [facility] for further management. Admits to a headache, right flank pain, malodorous urine. No nausea, vomiting, dysuria, hematuria or any other symptoms. Admits to smoking 1/2 ppd of cigarettes and does not wish to use any nicotine patch and also occasional social alcohol use.   ED Course: Febrile, hemodynamically stable, on room air. Notable Labs: Sodium 131, K2.9, BUN 8, creatinine 0.67, lactic acid 0.8, WBC 18.4, Hb 11.0, UA positive for UTI on 11/15. Notable Imaging: Renal US without hydronephrosis and normal appearance of the kidneys with echogenic debris in the urinary bladder. Patient received Toradol, fentanyl and zofran.    Vitals:   11/19/19 1100 11/19/19 1147  BP: 112/71 118/74  Pulse: 79 75  Resp: 18 16  Temp:  98.9 F (37.2 C)  SpO2: 100% 100%     Review of Systems:  Review of Systems  All other systems reviewed and are negative.   Medical/Social/Family History   Past Medical History: Past Medical History:  Diagnosis Date  . Narcotic abuse in remission [facility])     Past Surgical History:  Procedure Laterality Date  . CHOLECYSTECTOMY   11/19/2010   Procedure: LAPAROSCOPIC CHOLECYSTECTOMY;  Surgeon: Shelly Rubenstein, MD;  Location: [facility] OR;  Service: General;  Laterality: N/A;  . TUBAL LIGATION      Medications: Prior to Admission medications   Medication Sig Start Date End Date Taking? Authorizing Provider  cephALEXin (KEFLEX) 500 MG capsule Take 1 capsule (500 mg total) by mouth 2 (two) times daily for 10 days. 11/18/19 11/28/19  Curatolo, Adam, DO  fluconazole (DIFLUCAN) 150 MG tablet Take 1 tablet (150 mg total) by mouth daily. Take second dose 72 hours later if symptoms still persists. 08/19/19   Cathie Hoops, Amy V, PA-C  ondansetron (ZOFRAN) 4 MG tablet Take 1 tablet (4 mg total) by mouth every 6 (six) hours for 20 doses. 11/18/19 11/23/19  Virgina Norfolk, DO    Allergies:  No Known Allergies  Social History:  reports that she has been smoking cigarettes. She has a 4.50 pack-year smoking history. She has never used smokeless tobacco. She reports current alcohol use. She reports previous drug use. Drug: Marijuana.  Family History: Family History  Problem Relation Age of Onset  . Kidney cancer Other   . Lung cancer Other   . Healthy Mother   . Healthy Father      Objective   Physical  Exam: Blood pressure 118/74, pulse 75, temperature 98.9 F (37.2 C), temperature source Oral, resp. rate 16, height 5\' 2"  (1.575 m), weight 54.4 kg, last menstrual period 07/05/2019, SpO2 100 %.  Physical Exam Vitals and nursing note reviewed.  Constitutional:      Comments: Appears uncomfortable  HENT:     Head: Normocephalic and atraumatic.  Eyes:     Conjunctiva/sclera: Conjunctivae normal.  Cardiovascular:     Rate and Rhythm: Normal rate and regular rhythm.  Pulmonary:     Effort: Pulmonary effort is normal.     Breath sounds: Normal breath sounds.  Abdominal:     General: Abdomen is flat.     Palpations: Abdomen is soft.     Tenderness: There is right CVA tenderness. There is no left CVA tenderness.  Musculoskeletal:         General: No swelling or tenderness.  Skin:    Coloration: Skin is not jaundiced or pale.  Neurological:     Mental Status: She is alert. Mental status is at baseline.  Psychiatric:        Mood and Affect: Mood normal.        Behavior: Behavior normal.     LABS on Admission: I have personally reviewed all the labs and imaging below    Basic Metabolic Panel: Recent Labs  Lab 11/18/19 1913 11/19/19 0749  NA 135 131*  K 3.3* 2.9*  CL 100 99  CO2 25 24  GLUCOSE 117* 98  BUN 9 8  CREATININE 0.73 0.67  CALCIUM 8.9 8.2*   Liver Function Tests: Recent Labs  Lab 11/18/19 1913 11/19/19 0749  AST 25 22  ALT 20 19  ALKPHOS 73 89  BILITOT 0.9 0.8  PROT 7.5 7.1  ALBUMIN 3.7 3.3*   No results for input(s): LIPASE, AMYLASE in the last 168 hours. No results for input(s): AMMONIA in the last 168 hours. CBC: Recent Labs  Lab 11/18/19 1913 11/18/19 1913 11/19/19 0749  WBC 19.9*  --  18.4*  NEUTROABS 17.5*   < > 16.4*  HGB 11.9*  --  11.0*  HCT 35.1*  --  32.4*  MCV 91.6   < > 91.3  PLT 206  --  173   < > = values in this interval not displayed.   Cardiac Enzymes: No results for input(s): CKTOTAL, CKMB, CKMBINDEX, TROPONINI in the last 168 hours. BNP: Invalid input(s): POCBNP CBG: No results for input(s): GLUCAP in the last 168 hours.  Radiological Exams on Admission:  11/21/19 Renal  Result Date: 11/19/2019 CLINICAL DATA:  Pyelonephritis EXAM: RENAL / URINARY TRACT ULTRASOUND COMPLETE COMPARISON:  None. FINDINGS: Right Kidney: Renal measurements: 12.4 x 4.2 x 6.6 cm = volume: 181 mL. Echogenicity within normal limits. No mass or hydronephrosis visualized. Left Kidney: Renal measurements: 12.2 x 5.4 x 5.7 cm = volume: 197 mL. Echogenicity within normal limits. No mass or hydronephrosis visualized. Bladder: Echogenic debris within the urinary bladder. Other: None. IMPRESSION: 1. Normal ultrasound appearance of the kidneys.  No hydronephrosis. 2. Echogenic debris within the  urinary bladder. Correlate with urinalysis. Electronically Signed   By: 11/21/2019 M.D.   On: 11/19/2019 08:41      EKG: not done   A & P   Principal Problem:   Pyelonephritis Active Problems:   Hypokalemia   1. SIRS secondary to Pyelonephritis with history of pansensitive E. coli UTIs a. Low grade fever (100.6) which resolved, one episode of tachypnea with leukocytosis b. Start LR c. Follow  up cultures d. Continue Rocephin for now, pending cultures  2. Hypokalemia a. Replete PO  3. Tension Headache a. Tylenol     DVT prophylaxis: lovenox   Code Status: Full Code  Diet: regular Family Communication: Admission, patients condition and plan of care including tests being ordered have been discussed with the patient who indicates understanding and agrees with the plan and Code Status.  Disposition Plan: The appropriate patient status for this patient is INPATIENT. Inpatient status is judged to be reasonable and necessary in order to provide the required intensity of service to ensure the patient's safety. The patient's presenting symptoms, physical exam findings, and initial radiographic and laboratory data in the context of their chronic comorbidities is felt to place them at high risk for further clinical deterioration. Furthermore, it is not anticipated that the patient will be medically stable for discharge from the hospital within 2 midnights of admission. The following factors support the patient status of inpatient.   " The patient's presenting symptoms include flank pain, fever. " The worrisome physical exam findings include flank pain. " The initial radiographic and laboratory data are worrisome because of leukocytosis, positive UA. " The chronic co-morbidities are unremarkable.   * I certify that at the point of admission it is my clinical judgment that the patient will require inpatient hospital care spanning beyond 2 midnights from the point of admission due to  high intensity of service, high risk for further deterioration and high frequency of surveillance required.*   Status is: Inpatient  Remains inpatient appropriate because:IV treatments appropriate due to intensity of illness or inability to take PO and Inpatient level of care appropriate due to severity of illness   Dispo: The patient is from: Home              Anticipated d/c is to: Home              Anticipated d/c date is: 2 days              Patient currently is not medically stable to d/c.   Consultants  . None  Procedures  . None  Time Spent on Admission: 55 minutes    Jae Dire, DO [facility]  11/19/2019, 1:16 PM

## 2019-11-19 NOTE — ED Triage Notes (Signed)
Pt endorses right side flank pain and fever. Pt reports being treated in ED yesterday for same. P also reports HA

## 2019-11-19 NOTE — ED Notes (Signed)
Pt reports some improvement in pain level but remains 6/10, u/s in progress

## 2019-11-19 NOTE — ED Notes (Signed)
Report provided to [facility] for transport ETA [id number]

## 2019-11-19 NOTE — ED Provider Notes (Signed)
[facility] EMERGENCY DEPARTMENT Provider Note   CSN: [id number] Arrival date & time: 11/19/19  2979     History Chief Complaint  Patient presents with  . Flank Pain    Leba Tibbitts Sulton is a 34 y.o. female.  HPI Patient presents with flank pain and fever.  Has been seen in the ER yesterday and diagnosed with pyelonephritis.  Has been given Rocephin and then Keflex for home.  States not doing well at home.  Pain uncontrolled.  Now having fevers to.  Some nausea without vomiting.    Past Medical History:  Diagnosis Date  . Narcotic abuse in remission [facility])     Patient Active Problem List   Diagnosis Date Noted  . Acute cholecystitis 11/19/2010    Past Surgical History:  Procedure Laterality Date  . CHOLECYSTECTOMY  11/19/2010   Procedure: LAPAROSCOPIC CHOLECYSTECTOMY;  Surgeon: Shelly Rubenstein, MD;  Location: [facility] OR;  Service: General;  Laterality: N/A;  . TUBAL LIGATION       OB History   No obstetric history on file.     Family History  Problem Relation Age of Onset  . Kidney cancer Other   . Lung cancer Other   . Healthy Mother   . Healthy Father     Social History   Tobacco Use  . Smoking status: Current Every Day Smoker    Packs/day: 0.50    Years: 9.00    Pack years: 4.50    Types: Cigarettes  . Smokeless tobacco: Never Used  Vaping Use  . Vaping Use: Never used  Substance Use Topics  . Alcohol use: Yes    Comment: OCCASIONAL  . Drug use: Not Currently    Types: Marijuana    Comment: pain medication, pt in recovery    Home Medications Prior to Admission medications   Medication Sig Start Date End Date Taking? Authorizing Provider  cephALEXin (KEFLEX) 500 MG capsule Take 1 capsule (500 mg total) by mouth 2 (two) times daily for 10 days. 11/18/19 11/28/19  Curatolo, Adam, DO  fluconazole (DIFLUCAN) 150 MG tablet Take 1 tablet (150 mg total) by mouth daily. Take second dose 72 hours later if symptoms still persists. 08/19/19    Cathie Hoops, Amy V, PA-C  ondansetron (ZOFRAN) 4 MG tablet Take 1 tablet (4 mg total) by mouth every 6 (six) hours for 20 doses. 11/18/19 11/23/19  Virgina Norfolk, DO    Allergies    Patient has no known allergies.  Review of Systems   Review of Systems  Constitutional: Positive for appetite change and fever.  HENT: Negative for congestion.   Respiratory: Negative for shortness of breath.   Gastrointestinal: Positive for nausea.  Genitourinary: Positive for dysuria and flank pain.  Musculoskeletal: Positive for back pain.  Skin: Negative for rash.  Neurological: Positive for headaches. Negative for weakness.  Psychiatric/Behavioral: Negative for confusion.    Physical Exam Updated Vital Signs BP 105/64 (BP Location: Left Arm)   Pulse 95   Temp 99.8 F (37.7 C) (Oral)   Resp 20   Ht 5\' 2"  (1.575 m)   Wt 54.4 kg   LMP 07/05/2019   SpO2 100%   BMI 21.95 kg/m   Physical Exam Vitals and nursing note reviewed.  HENT:     Head: Normocephalic.     Right Ear: External ear normal.     Left Ear: External ear normal.  Eyes:     General: No scleral icterus.    Pupils: Pupils are equal, round,  and reactive to light.  Cardiovascular:     Rate and Rhythm: Regular rhythm. Tachycardia present.  Pulmonary:     Breath sounds: No wheezing or rhonchi.  Abdominal:     Tenderness: There is abdominal tenderness.     Comments: Mild right-sided tenderness.  No rebound or guarding.  Genitourinary:    Comments: CVA tenderness on right. Musculoskeletal:        General: No tenderness.     Cervical back: Neck supple.  Skin:    General: Skin is warm.     Capillary Refill: Capillary refill takes less than 2 seconds.  Neurological:     Mental Status: She is alert and oriented to person, place, and time.  Psychiatric:     Comments: Patient is tearful.     ED Results / Procedures / Treatments   Labs (all labs ordered are listed, but only abnormal results are displayed) Labs Reviewed    COMPREHENSIVE METABOLIC PANEL - Abnormal; Notable for the following components:      Result Value   Sodium 131 (*)    Potassium 2.9 (*)    Calcium 8.2 (*)    Albumin 3.3 (*)    All other components within normal limits  CBC WITH DIFFERENTIAL/PLATELET - Abnormal; Notable for the following components:   WBC 18.4 (*)    RBC 3.55 (*)    Hemoglobin 11.0 (*)    HCT 32.4 (*)    Neutro Abs 16.4 (*)    Lymphs Abs 0.5 (*)    Monocytes Absolute 1.4 (*)    Abs Immature Granulocytes 0.15 (*)    All other components within normal limits  RESPIRATORY PANEL BY RT PCR (FLU A&B, COVID)  CULTURE, BLOOD (ROUTINE X 2)  CULTURE, BLOOD (ROUTINE X 2)  LACTIC ACID, PLASMA    EKG None  Radiology US Renal  Result Date: 11/19/2019 CLINICAL DATA:  Pyelonephritis EXAM: RENAL / URINARY TRACT ULTRASOUND COMPLETE COMPARISON:  None. FINDINGS: Right Kidney: Renal measurements: 12.4 x 4.2 x 6.6 cm = volume: 181 mL. Echogenicity within normal limits. No mass or hydronephrosis visualized. Left Kidney: Renal measurements: 12.2 x 5.4 x 5.7 cm = volume: 197 mL. Echogenicity within normal limits. No mass or hydronephrosis visualized. Bladder: Echogenic debris within the urinary bladder. Other: None. IMPRESSION: 1. Normal ultrasound appearance of the kidneys.  No hydronephrosis. 2. Echogenic debris within the urinary bladder. Correlate with urinalysis. Electronically Signed   By: Lauralyn Primes M.D.   On: 11/19/2019 08:41    Procedures Procedures (including critical care time)  Medications Ordered in ED Medications  fentaNYL (SUBLIMAZE) injection 50 mcg (50 mcg Intravenous Given 11/19/19 0800)  ondansetron (ZOFRAN) injection 4 mg (4 mg Intravenous Given 11/19/19 0800)  ketorolac (TORADOL) 30 MG/ML injection 15 mg (15 mg Intravenous Given 11/19/19 2500)    ED Course  I have reviewed the triage vital signs and the nursing notes.  Pertinent labs & imaging results that were available during my care of the patient  were reviewed by me and considered in my medical decision making (see chart for details).    MDM Rules/Calculators/A&P                         Patient returns after being diagnosed with pyelonephritis yesterday.  Has fever.  States pain fevers and unable to manage this at home.  Some nausea.  White count still elevated.  Has a fever.  Fortunately lactic acid reassuring.  Ultrasound done to rule out  any obstructive findings and there was none.  However did not tolerate at home well.  Think will require inpatient admission.  Had 2 g of Rocephin last evening so not due for new antibiotic yet.  Will admit to hospitalist. Final Clinical Impression(s) / ED Diagnoses Final diagnoses:  Pyelonephritis    Rx / DC Orders ED Discharge Orders    None       Benjiman Core, MD 11/19/19 [phone]

## 2019-11-20 DIAGNOSIS — D72829 Elevated white blood cell count, unspecified: Secondary | ICD-10-CM

## 2019-11-20 DIAGNOSIS — D649 Anemia, unspecified: Secondary | ICD-10-CM

## 2019-11-20 DIAGNOSIS — R739 Hyperglycemia, unspecified: Secondary | ICD-10-CM

## 2019-11-20 DIAGNOSIS — E871 Hypo-osmolality and hyponatremia: Secondary | ICD-10-CM

## 2019-11-20 LAB — CBC
HCT: 31.3 % — ABNORMAL LOW (ref 36.0–46.0)
Hemoglobin: 10.4 g/dL — ABNORMAL LOW (ref 12.0–15.0)
MCH: 31.1 pg (ref 26.0–34.0)
MCHC: 33.2 g/dL (ref 30.0–36.0)
MCV: 93.7 fL (ref 80.0–100.0)
Platelets: 168 10*3/uL (ref 150–400)
RBC: 3.34 MIL/uL — ABNORMAL LOW (ref 3.87–5.11)
RDW: 12.7 % (ref 11.5–15.5)
WBC: 12.9 10*3/uL — ABNORMAL HIGH (ref 4.0–10.5)
nRBC: 0 % (ref 0.0–0.2)

## 2019-11-20 LAB — BASIC METABOLIC PANEL
Anion gap: 6 (ref 5–15)
BUN: 9 mg/dL (ref 6–20)
CO2: 25 mmol/L (ref 22–32)
Calcium: 8.2 mg/dL — ABNORMAL LOW (ref 8.9–10.3)
Chloride: 102 mmol/L (ref 98–111)
Creatinine, Ser: 0.61 mg/dL (ref 0.44–1.00)
GFR, Estimated: >60 mL/min (ref >60)
Glucose, Bld: 135 mg/dL — ABNORMAL HIGH (ref 70–99)
Potassium: 3.4 mmol/L — ABNORMAL LOW (ref 3.5–5.1)
Sodium: 133 mmol/L — ABNORMAL LOW (ref 135–145)

## 2019-11-20 LAB — PHOSPHORUS: Phosphorus: 1.9 mg/dL — ABNORMAL LOW (ref 2.5–4.6)

## 2019-11-20 LAB — URINE CULTURE: Culture: >=100000 — AB

## 2019-11-20 LAB — MAGNESIUM: Magnesium: 2 mg/dL (ref 1.7–2.4)

## 2019-11-20 MED ORDER — SODIUM CHLORIDE 0.9 % IV SOLN: INTRAVENOUS | Status: DC

## 2019-11-20 MED ORDER — POTASSIUM PHOSPHATES 15 MMOLE/5ML IV SOLN
20.0000 mmol | Freq: Once | INTRAVENOUS | Status: AC
  Administered 2019-11-20: 20 mmol via INTRAVENOUS
  Filled 2019-11-20: qty 6.67

## 2019-11-20 MED ORDER — CEFAZOLIN SODIUM-DEXTROSE 2-4 GM/100ML-% IV SOLN
2.0000 g | Freq: Three times a day (TID) | INTRAVENOUS | Status: DC
  Administered 2019-11-20 – 2019-11-21 (×3): 2 g via INTRAVENOUS
  Filled 2019-11-20 (×4): qty 100

## 2019-11-20 MED ORDER — POTASSIUM CHLORIDE CRYS ER 20 MEQ PO TBCR
40.0000 meq | EXTENDED_RELEASE_TABLET | Freq: Once | ORAL | Status: AC
  Administered 2019-11-20: 40 meq via ORAL
  Filled 2019-11-20: qty 2

## 2019-11-20 NOTE — Progress Notes (Addendum)
PROGRESS NOTE    Katie Glover  IFO:[id number] DOB: 12-Jun-1985 DOA: 11/19/2019 PCP: System, Provider Not In  Brief Narrative:  HPI per Dr. Whitney Post on 11/19/19 This is a 34 year old female with no significant past medical history presented to [facility] with flank pain and fever.  She was initially seen in the ED yesterday for fever of 101 F and symptoms of a UTI.  Noted to have right CVA tenderness yesterday.  She was treated with Rocephin and discharged on p.o. Keflex and Zofran however returned to the ED today with persistent fevers and uncontrolled pain.  She was transferred to [facility] for further management. Admits to a headache, right flank pain, malodorous urine. No nausea, vomiting, dysuria, hematuria or any other symptoms. Admits to smoking 1/2 ppd of cigarettes and does not wish to use any nicotine patch and also occasional social alcohol use.   ED Course: Febrile, hemodynamically stable, on room air. Notable Labs: Sodium 131, K2.9, BUN 8, creatinine 0.67, lactic acid 0.8, WBC 18.4, Hb 11.0, UA positive for UTI on 11/15. Notable Imaging: Renal US without hydronephrosis and normal appearance of the kidneys with echogenic debris in the urinary bladder. Patient received Toradol, fentanyl and zofran  **Interim History Patient's flank pain is improving and antibiotics to be de-escalate it.  Anticipating discharging home in the next 24 to 48 hours if she is improved.  Assessment & Plan:   Principal Problem:   Pyelonephritis Active Problems:   Hypokalemia  Sepsis secondary  Right-sided Pyelonephritis with history of pansensitive E. coli UTI, present on Admission -Presented Low grade fever (100.6) which resolved, one episode of tachypnea, associated with Leukocytosis and a source of Infection -Sepsis Physiology is improving -WBC went from 19.9 -> 18.4 -> 12.9 -Continue IVF Hydration with normal saline at 75 MLS per hour -LA was 0.8 -Urinalysis on admission showed a cloudy appearance  with moderate hemoglobin, greater than 80 ketones, large leukocytes, positive nitrites, many bacteria, present now squamous epithelial cells, 6-10 RBCs per high-power field, 6-10 squamous epithelial cells, greater than 50 WBCs and urine cultures pansensitive E. Coli -Blood cultures x2 no growth to date less than 12 hours -Renal U/S showed "Normal ultrasound appearance of the kidneys.  No hydronephrosis. Echogenic debris within the urinary bladder. Correlate with urinalysis.." -She was given an LR 1 L bolus and then started on maintenance IV fluid with normal saline at 75 mL's per hour and will continue for now -Continue to follow her blood cultures  -Changed IV Rocephin to IV Ancef given her pan sensitivities -Continue with supportive care continue with antiemetics with p.o./IV 4 mg of ondansetron every 6 hours as needed for nausea  Hypokalemia  -Potassium on admission was 3.3 and then trended down to 2.9 and this morning is 3.4 -Replete with p.o. KCl 40 mEq x 1 yesterday and will replete with p.o. KCl 40 mg again this morning and also replete with IV K-Phos 20 mmol -Patient's magnesium level was 2.0 next-continue to monitor and replete as necessary -Repeat CMP in a.m.  Hypophosphatemia -Phos level was 1.9 -Replete with IV K-Phos 20 mmol -Continue to monitor and replete as necessary -Repeat phosphorus level in a.m.  Hyponatremia -Mild and sodium went from 135 and trended down to 131 is now 133 -C/w IVF with normal saline at 75 MLS per hour  -Continue to monitor replete as necessary -Repeat CMP in the AM   Hyperglycemia -Likely reactive but will need to rule out diabetes -Check hemoglobin A1c in the a.m.  -Blood  sugars are ranging from 98-135 on daily BMP/CMP's -Continue to monitor and trend blood sugars carefully and if necessary will place on sensitive NovoLog sliding scale insulin AC  Tension Headache -Given acetaminophen without improvement -We will try Fioricet -Has ketorolac  50 mg IV every 6 hours as needed for severe pain  Tobacco Abuse -Smokes half a pack per day -Does not want to use nicotine patches -Smoking cessation counseling given  Normocytic Anemia -Hemoglobin/hematocrit went from 11.9/35.1 on admission is trended down to 10.4/31.3 next-question dilutional drop in the setting of IV fluid resuscitation but may have a component of underlying iron deficiency anemia -Check anemia panel in the a.m. -Continue to monitor for signs and symptoms of bleeding; currently no overt bleeding noted -Repeat CBC in a.m.  DVT prophylaxis: Enoxaparin 40 mg sq q24h Code Status: FULL CODE Family Communication: No family present at bedside  Disposition Plan: Anticipating discharging home in the next 24 to 48 hours  Status is: Inpatient  Remains inpatient appropriate because:IV treatments appropriate due to intensity of illness or inability to take PO and Inpatient level of care appropriate due to severity of illness   Dispo: The patient is from: Home              Anticipated d/c is to: Home              Anticipated d/c date is: 1 day              Patient currently is not medically stable to d/c.   Consultants:   None   Procedures: Renal U/S  Antimicrobials:  Anti-infectives (From admission, onward)   Start     Dose/Rate Route Frequency Ordered Stop   11/19/19 2000  cefTRIAXone (ROCEPHIN) 2 g in sodium chloride 0.9 % 100 mL IVPB        2 g 200 mL/hr over 30 Minutes Intravenous Every 24 hours 11/19/19 1244          Subjective: Seen and examined at bedside and states that she is feeling much better today than she was yesterday.  Not as much flank pain today.  No chest pain, lightheadedness or dizziness.  No other concerns or complaints at this time.  Objective: Vitals:   11/19/19 1359 11/19/19 1800 11/19/19 2217 11/20/19 0204  BP: 116/75 113/67 113/72 101/75  Pulse: 78 (!) 53 75 71  Resp: 16 18 18 15   Temp: 97.9 F (36.6 C) 97.9 F (36.6 C) 99.8 F  (37.7 C) 98.9 F (37.2 C)  TempSrc: Oral Oral Oral Oral  SpO2: 100% 100% 95% 100%  Weight:      Height:        Intake/Output Summary (Last 24 hours) at 11/20/2019 0857 Last data filed at 11/20/2019 0500 Gross per 24 hour  Intake 1181.99 ml  Output --  Net 1181.99 ml   Filed Weights   11/19/19 0716  Weight: 54.4 kg   Examination: Physical Exam:  Constitutional: WN/WD thin Caucasian female currently in NAD and appears calm and comfortable Eyes: Lids and conjunctivae normal, sclerae anicteric  ENMT: External Ears, Nose appear normal. Grossly normal hearing.  Neck: Appears normal, supple, no cervical masses, normal ROM, no appreciable thyromegaly; no JVD Respiratory: Diminished to auscultation bilaterally, no wheezing, rales, rhonchi or crackles. Normal respiratory effort and patient is not tachypenic. No accessory muscle use.  Cardiovascular: RRR, no murmurs / rubs / gallops. S1 and S2 auscultated.  Abdomen: Soft, non-tender, non-distended. Bowel sounds positive.  GU: Deferred. Musculoskeletal: No clubbing /  cyanosis of digits/nails. No joint deformity upper and lower extremities.  Skin: No rashes, lesions, ulcers on limited skin evaluation but she does have tattoos scattered throughout her body. No induration; Warm and dry.  Neurologic: CN 2-12 grossly intact with no focal deficits. Strength 5/5 in all 4. Romberg sign cerebellar reflexes not assessed.  Psychiatric: Normal judgment and insight. Alert and oriented x 3. Normal mood and appropriate affect.   Data Reviewed: I have personally reviewed following labs and imaging studies  CBC: Recent Labs  Lab 11/18/19 1913 11/19/19 0749 11/20/19 0413  WBC 19.9* 18.4* 12.9*  NEUTROABS 17.5* 16.4*  --   HGB 11.9* 11.0* 10.4*  HCT 35.1* 32.4* 31.3*  MCV 91.6 91.3 93.7  PLT 206 173 168   Basic Metabolic Panel: Recent Labs  Lab 11/18/19 1913 11/19/19 0749 11/20/19 0413  NA 135 131* 133*  K 3.3* 2.9* 3.4*  CL 100 99 102   CO2 25 24 25   GLUCOSE 117* 98 135*  BUN 9 8 9   CREATININE 0.73 0.67 0.61  CALCIUM 8.9 8.2* 8.2*   GFR: Estimated Creatinine Clearance: 77.6 mL/min (by C-G formula based on SCr of 0.61 mg/dL). Liver Function Tests: Recent Labs  Lab 11/18/19 1913 11/19/19 0749  AST 25 22  ALT 20 19  ALKPHOS 73 89  BILITOT 0.9 0.8  PROT 7.5 7.1  ALBUMIN 3.7 3.3*   No results for input(s): LIPASE, AMYLASE in the last 168 hours. No results for input(s): AMMONIA in the last 168 hours. Coagulation Profile: No results for input(s): INR, PROTIME in the last 168 hours. Cardiac Enzymes: No results for input(s): CKTOTAL, CKMB, CKMBINDEX, TROPONINI in the last 168 hours. BNP (last 3 results) No results for input(s): PROBNP in the last 8760 hours. HbA1C: No results for input(s): HGBA1C in the last 72 hours. CBG: No results for input(s): GLUCAP in the last 168 hours. Lipid Profile: No results for input(s): CHOL, HDL, LDLCALC, TRIG, CHOLHDL, LDLDIRECT in the last 72 hours. Thyroid Function Tests: No results for input(s): TSH, T4TOTAL, FREET4, T3FREE, THYROIDAB in the last 72 hours. Anemia Panel: No results for input(s): VITAMINB12, FOLATE, FERRITIN, TIBC, IRON, RETICCTPCT in the last 72 hours. Sepsis Labs: Recent Labs  Lab 11/19/19 0750  LATICACIDVEN 0.8    Recent Results (from the past 240 hour(s))  Urine culture     Status: Abnormal   Collection Time: 11/18/19  7:13 PM   Specimen: Urine, Random  Result Value Ref Range Status   Specimen Description   Final    URINE, RANDOM Performed at [facility], [address] [place], [address] [place]    Special Requests   Final    NONE Performed at [facility], [address] [place], [place]    Culture >=100,000 COLONIES/mL ESCHERICHIA COLI (A)  Final   Report Status 11/20/2019 FINAL  Final   Organism ID, Bacteria ESCHERICHIA COLI (A)  Final      Susceptibility   Escherichia coli - MIC*    AMPICILLIN 4  SENSITIVE Sensitive     CEFAZOLIN <=4 SENSITIVE Sensitive     CEFEPIME <=0.12 SENSITIVE Sensitive     CEFTRIAXONE <=0.25 SENSITIVE Sensitive     CIPROFLOXACIN <=0.25 SENSITIVE Sensitive     GENTAMICIN <=1 SENSITIVE Sensitive     IMIPENEM <=0.25 SENSITIVE Sensitive     NITROFURANTOIN <=16 SENSITIVE Sensitive     TRIMETH/SULFA <=20 SENSITIVE Sensitive     AMPICILLIN/SULBACTAM <=2 SENSITIVE Sensitive  PIP/TAZO <=4 SENSITIVE Sensitive     * >=100,000 COLONIES/mL ESCHERICHIA COLI  Culture, blood (routine x 2)     Status: None (Preliminary result)   Collection Time: 11/19/19  7:50 AM   Specimen: BLOOD  Result Value Ref Range Status   Specimen Description   Final    BLOOD RIGHT ANTECUBITAL Performed at [facility], [address] [place], [place] [address]    Special Requests   Final    BOTTLES DRAWN AEROBIC AND ANAEROBIC Blood Culture adequate volume Performed at [facility], [address] [place], [place] [address]    Culture   Final    NO GROWTH < 12 HOURS Performed at [facility], [address], [place], [place] [address]    Report Status PENDING  Incomplete  Culture, blood (routine x 2)     Status: None (Preliminary result)   Collection Time: 11/19/19  8:00 AM   Specimen: BLOOD RIGHT FOREARM  Result Value Ref Range Status   Specimen Description   Final    BLOOD RIGHT FOREARM Performed at [facility], [address] [place], [place] [address]    Special Requests   Final    BOTTLES DRAWN AEROBIC AND ANAEROBIC Blood Culture adequate volume Performed at [facility], [address] [place], [place] [address]    Culture   Final    NO GROWTH < 12 HOURS Performed at [facility], [address] [place], [place] [address]    Report Status PENDING  Incomplete  Respiratory Panel by RT PCR (Flu A&B, Covid) - Nasopharyngeal Swab     Status: None   Collection Time: 11/19/19  8:00 AM   Specimen: Nasopharyngeal Swab    Result Value Ref Range Status   SARS Coronavirus 2 by RT PCR NEGATIVE NEGATIVE Final    Comment: (NOTE) SARS-CoV-2 target nucleic acids are NOT DETECTED.  The SARS-CoV-2 RNA is generally detectable in upper respiratoy specimens during the acute phase of infection. The lowest concentration of SARS-CoV-2 viral copies this assay can detect is 131 copies/mL. A negative result does not preclude SARS-Cov-2 infection and should not be used as the sole basis for treatment or other patient management decisions. A negative result may occur with  improper specimen collection/handling, submission of specimen other than nasopharyngeal swab, presence of viral mutation(s) within the areas targeted by this assay, and inadequate number of viral copies (<131 copies/mL). A negative result must be combined with clinical observations, patient history, and epidemiological information. The expected result is Negative.  Fact Sheet for Patients:  https://www.moore.com/  Fact Sheet for Healthcare Providers:  https://www.young.biz/  This test is no t yet approved or cleared by the [place] FDA and  has been authorized for detection and/or diagnosis of SARS-CoV-2 by FDA under an Emergency Use Authorization (EUA). This EUA will remain  in effect (meaning this test can be used) for the duration of the COVID-19 declaration under Section 564(b)(1) of the Act, 21 U.S.C. section 360bbb-3(b)(1), unless the authorization is terminated or revoked sooner.     Influenza A by PCR NEGATIVE NEGATIVE Final   Influenza B by PCR NEGATIVE NEGATIVE Final    Comment: (NOTE) The Xpert Xpress SARS-CoV-2/FLU/RSV assay is intended as an aid in  the diagnosis of influenza from Nasopharyngeal swab specimens and  should not be used as a sole basis for treatment. Nasal washings and  aspirates are unacceptable for Xpert Xpress SARS-CoV-2/FLU/RSV  testing.  Fact Sheet for  Patients: https://www.moore.com/https://www.fda.gov/media/142436/download  Fact Sheet for Healthcare Providers: https://www.young.biz/https://www.fda.gov/media/142435/download  This test is not yet approved or cleared by the [place] FDA and  has been authorized for detection and/or diagnosis of SARS-CoV-2 by  FDA under an Emergency Use Authorization (EUA). This EUA will remain  in effect (meaning this test can be used) for the duration of the  Covid-19 declaration under Section 564(b)(1) of the Act, 21  U.S.C. section 360bbb-3(b)(1), unless the authorization is  terminated or revoked. Performed at [facility], [address] [place], [place] [id number]      RN Pressure Injury Documentation:     Estimated body mass index is 21.95 kg/m as calculated from the following:   Height as of this encounter: 5\' 2"  (1.575 m).   Weight as of this encounter: 54.4 kg.  Malnutrition Type:      Malnutrition Characteristics:      Nutrition Interventions:     Radiology Studies: US Renal  Result Date: 11/19/2019 CLINICAL DATA:  Pyelonephritis EXAM: RENAL / URINARY TRACT ULTRASOUND COMPLETE COMPARISON:  None. FINDINGS: Right Kidney: Renal measurements: 12.4 x 4.2 x 6.6 cm = volume: 181 mL. Echogenicity within normal limits. No mass or hydronephrosis visualized. Left Kidney: Renal measurements: 12.2 x 5.4 x 5.7 cm = volume: 197 mL. Echogenicity within normal limits. No mass or hydronephrosis visualized. Bladder: Echogenic debris within the urinary bladder. Other: None. IMPRESSION: 1. Normal ultrasound appearance of the kidneys.  No hydronephrosis. 2. Echogenic debris within the urinary bladder. Correlate with urinalysis. Electronically Signed   By: Lauralyn PrimesAlex  Bibbey M.D.   On: 11/19/2019 08:41   Scheduled Meds: . enoxaparin (LOVENOX) injection  40 mg Subcutaneous Q24H  . potassium chloride  40 mEq Oral Once   Continuous Infusions: . sodium chloride 250 mL (11/19/19 2014)  . cefTRIAXone (ROCEPHIN)  IV 2 g (11/19/19  2014)    LOS: 1 day   Merlene Laughtermair Latif Joan Avetisyan, DO [facility] PAGER is on AMION  If 7PM-7AM, please contact night-coverage [email or web address]

## 2019-11-21 ENCOUNTER — Telehealth: Payer: Self-pay

## 2019-11-21 DIAGNOSIS — D509 Iron deficiency anemia, unspecified: Secondary | ICD-10-CM

## 2019-11-21 LAB — COMPREHENSIVE METABOLIC PANEL
ALT: 24 U/L (ref 0–44)
AST: 25 U/L (ref 15–41)
Albumin: 3 g/dL — ABNORMAL LOW (ref 3.5–5.0)
Alkaline Phosphatase: 102 U/L (ref 38–126)
Anion gap: 9 (ref 5–15)
BUN: 7 mg/dL (ref 6–20)
CO2: 24 mmol/L (ref 22–32)
Calcium: 8.6 mg/dL — ABNORMAL LOW (ref 8.9–10.3)
Chloride: 106 mmol/L (ref 98–111)
Creatinine, Ser: 0.59 mg/dL (ref 0.44–1.00)
GFR, Estimated: >60 mL/min (ref >60)
Glucose, Bld: 94 mg/dL (ref 70–99)
Potassium: 4 mmol/L (ref 3.5–5.1)
Sodium: 139 mmol/L (ref 135–145)
Total Bilirubin: 0.3 mg/dL (ref 0.3–1.2)
Total Protein: 6.5 g/dL (ref 6.5–8.1)

## 2019-11-21 LAB — FERRITIN: Ferritin: 181 ng/mL (ref 11–307)

## 2019-11-21 LAB — CBC WITH DIFFERENTIAL/PLATELET
Abs Immature Granulocytes: 0.08 10*3/uL — ABNORMAL HIGH (ref 0.00–0.07)
Basophils Absolute: 0 10*3/uL (ref 0.0–0.1)
Basophils Relative: 0 %
Eosinophils Absolute: 0.2 10*3/uL (ref 0.0–0.5)
Eosinophils Relative: 2 %
HCT: 33.2 % — ABNORMAL LOW (ref 36.0–46.0)
Hemoglobin: 10.8 g/dL — ABNORMAL LOW (ref 12.0–15.0)
Immature Granulocytes: 1 %
Lymphocytes Relative: 22 %
Lymphs Abs: 1.9 10*3/uL (ref 0.7–4.0)
MCH: 30.9 pg (ref 26.0–34.0)
MCHC: 32.5 g/dL (ref 30.0–36.0)
MCV: 95.1 fL (ref 80.0–100.0)
Monocytes Absolute: 0.8 10*3/uL (ref 0.1–1.0)
Monocytes Relative: 9 %
Neutro Abs: 5.7 10*3/uL (ref 1.7–7.7)
Neutrophils Relative %: 66 %
Platelets: 207 10*3/uL (ref 150–400)
RBC: 3.49 MIL/uL — ABNORMAL LOW (ref 3.87–5.11)
RDW: 12.9 % (ref 11.5–15.5)
WBC: 8.6 10*3/uL (ref 4.0–10.5)
nRBC: 0 % (ref 0.0–0.2)

## 2019-11-21 LAB — RETICULOCYTES
Immature Retic Fract: 7.2 % (ref 2.3–15.9)
RBC.: 3.52 MIL/uL — ABNORMAL LOW (ref 3.87–5.11)
Retic Count, Absolute: 32.4 10*3/uL (ref 19.0–186.0)
Retic Ct Pct: 0.9 % (ref 0.4–3.1)

## 2019-11-21 LAB — MAGNESIUM: Magnesium: 1.8 mg/dL (ref 1.7–2.4)

## 2019-11-21 LAB — IRON AND TIBC
Iron: 15 ug/dL — ABNORMAL LOW (ref 28–170)
Saturation Ratios: 8 % — ABNORMAL LOW (ref 10.4–31.8)
TIBC: 194 ug/dL — ABNORMAL LOW (ref 250–450)
UIBC: 179 ug/dL

## 2019-11-21 LAB — PHOSPHORUS: Phosphorus: 3.2 mg/dL (ref 2.5–4.6)

## 2019-11-21 LAB — FOLATE: Folate: 10.3 ng/mL (ref >5.9)

## 2019-11-21 LAB — HEMOGLOBIN A1C
Hgb A1c MFr Bld: 5.2 % (ref 4.8–5.6)
Mean Plasma Glucose: 102.54 mg/dL

## 2019-11-21 LAB — VITAMIN B12: Vitamin B-12: 765 pg/mL (ref 180–914)

## 2019-11-21 LAB — HIV ANTIBODY (ROUTINE TESTING W REFLEX): HIV Screen 4th Generation wRfx: NONREACTIVE

## 2019-11-21 MED ORDER — MAGNESIUM SULFATE 2 GM/50ML IV SOLN
2.0000 g | Freq: Once | INTRAVENOUS | Status: AC
  Administered 2019-11-21: 2 g via INTRAVENOUS
  Filled 2019-11-21: qty 50

## 2019-11-21 MED ORDER — POLYSACCHARIDE IRON COMPLEX 150 MG PO CAPS
150.0000 mg | ORAL_CAPSULE | Freq: Every day | ORAL | Status: DC
  Administered 2019-11-21: 150 mg via ORAL
  Filled 2019-11-21: qty 1

## 2019-11-21 MED ORDER — ACETAMINOPHEN 325 MG PO TABS
650.0000 mg | ORAL_TABLET | Freq: Four times a day (QID) | ORAL | 0 refills | Status: DC | PRN
Start: 2019-11-21 — End: 2021-03-05

## 2019-11-21 MED ORDER — VALACYCLOVIR HCL 1 G PO TABS: 1000.0000 mg | ORAL_TABLET | Freq: Two times a day (BID) | ORAL | 0 refills | Status: AC

## 2019-11-21 MED ORDER — ONDANSETRON HCL 4 MG PO TABS: 4.0000 mg | ORAL_TABLET | Freq: Four times a day (QID) | ORAL | 0 refills | Status: DC | PRN

## 2019-11-21 MED ORDER — CEPHALEXIN 500 MG PO CAPS: 500.0000 mg | ORAL_CAPSULE | Freq: Four times a day (QID) | ORAL | 0 refills | Status: AC

## 2019-11-21 MED ORDER — POLYSACCHARIDE IRON COMPLEX 150 MG PO CAPS
150.0000 mg | ORAL_CAPSULE | Freq: Every day | ORAL | 0 refills | Status: DC
Start: 2019-11-22 — End: 2020-09-30

## 2019-11-21 MED ORDER — VALACYCLOVIR HCL 500 MG PO TABS
1000.0000 mg | ORAL_TABLET | Freq: Two times a day (BID) | ORAL | Status: DC
  Filled 2019-11-21: qty 2

## 2019-11-21 MED ORDER — CEPHALEXIN 500 MG PO CAPS: 500.0000 mg | ORAL_CAPSULE | Freq: Four times a day (QID) | ORAL | Status: DC

## 2019-11-21 NOTE — Telephone Encounter (Signed)
Post ED Visit - Positive Culture Follow-up  Culture report reviewed by antimicrobial stewardship pharmacist: [facility] []  , Pharm.D. []  Enzo Bi, Pharm.D., BCPS AQ-ID []  , Pharm.D., BCPS []  Celedonio Miyamoto, Pharm.D., BCPS []  [place], Garvin Fila.D., BCPS, AAHIVP []  , Pharm.D., BCPS, AAHIVP []  Georgina Pillion, PharmD, BCPS []  , PharmD, BCPS []  [place], PharmD, BCPS []  [place], PharmD []  , PharmD, BCPS [x]  Estella Husk, PharmD  Pharmacy Team []  Lysle Pearl, PharmD []  , PharmD []  Phillips Climes, PharmD []  , Rph []  Agapito Games) , PharmD []  Verlan Friends, PharmD []  , PharmD []  Mervyn Gay, PharmD []  , PharmD []  Vinnie Level, PharmD []  [facility], PharmD []  , PharmD []  Len Childs, PharmD   Positive urine culture Treated with Cephalexin, organism sensitive to the same and no further patient follow-up is required at this time.  11/21/2019, 10:28 AM

## 2019-11-21 NOTE — Discharge Summary (Addendum)
Physician Discharge Summary  NADIE FIUMARA ZOX:[id number] DOB: 07-Aug-1985 DOA: 11/19/2019  PCP: System, Provider Not In  Admit date: 11/19/2019 Discharge date: 11/21/2019  Admitted From: Home Disposition: Home  Recommendations for Outpatient Follow-up:  1. Follow up with PCP in 1-2 weeks 2. Please obtain CMP/CBC, Mag, Phos in one week 3. Please follow up on the following pending results:  Home Health: No Equipment/Devices: None  Discharge Condition: Stable CODE STATUS: FULL CODE Diet recommendation: Regular Diet   Brief/Interim Summary: HPI per Dr. Whitney Post on 11/19/19 This is a 34 year old female with no significant past medical history presented to [facility] with flank pain and fever. She was initially seen in the ED yesterday for fever of 101 F and symptoms of a UTI. Noted to have right CVA tenderness yesterday. She was treated with Rocephin and discharged on p.o. Keflex and Zofran however returned to the ED today with persistent fevers and uncontrolled pain. She was transferred to [facility] for further management. Admits to a headache, right flank pain, malodorous urine. No nausea, vomiting, dysuria, hematuria or any other symptoms. Admits to smoking 1/2 ppd of cigarettes and does not wish to use any nicotine patch and also occasional social alcohol use.   ED Course:Febrile, hemodynamically stable, on room air. Notable Labs:Sodium 131, K2.9, BUN 8, creatinine 0.67, lactic acid 0.8, WBC 18.4, Hb 11.0, UA positive for UTI on 11/15. Notable Imaging:Renal US without hydronephrosis and normal appearance of the kidneys with echogenic debris in the urinary bladder. Patient receivedToradol, fentanyl and zofran  **Interim History Patient's flank pain is improving and antibiotics were deescalated to p.o. cephalexin to 500 mg 4 times daily.  She subsequently improved and sepsis physiology resolved.  She did have a fever blister and was treated with oral Valtrex and was discharged with  this as well.  She is no longer septic and stable for discharge and will need to follow-up with PCP in outpatient setting.  Discharge Diagnoses:  Principal Problem:   Pyelonephritis Active Problems:   Hypokalemia  Sepsis secondary  Right-sidedPyelonephritis with history of pansensitive E. coli UTI, present on Admission -Presented Low grade fever (100.6) which resolved, one episode of tachypnea, associated with Leukocytosis and a source of Infection -Sepsis Physiology is improving -WBC went from 19.9 -> 18.4 -> 12.9 and today is now 8.7 -Continue IVF Hydration with normal saline at 75 MLS per hour and now fluid hydration is stopped -LA was 0.8 -Urinalysis on admission showed a cloudy appearance with moderate hemoglobin, greater than 80 ketones, large leukocytes, positive nitrites, many bacteria, present now squamous epithelial cells, 6-10 RBCs per high-power field, 6-10 squamous epithelial cells, greater than 50 WBCs and urine cultures pansensitive E. Coli -Blood cultures x2 no growth to date at 2 days -Renal U/S showed "Normal ultrasound appearance of the kidneys. No hydronephrosis. Echogenic debris within the urinary bladder. Correlate with urinalysis.." -She was given an LR 1 L bolus and then started on maintenance IV fluid with normal saline at 75 mL's per hour and will continue for now -Continue to follow her blood cultures  -Changed IV Rocephin to IV Ancef given her pan sensitivities -Continue with supportive care continue with antiemetics with p.o./IV 4 mg of ondansetron every 6 hours as needed for nausea -She is improved and will change her antibiotics to p.o. cephalexin on discharge  Hypokalemia  -Potassium now resolved after repletion is now 4.0 -Patient's magnesium level was 1.8 and will replete prior to discharge -continue to monitor and replete as necessary -Repeat CMP within  1 week  Hypophosphatemia -Phos level was 1.9 and now improved to 3.2 -Replete with IV K-Phos 20  mmol yesterday -Continue to monitor and replete as necessary -Repeat phosphorus level within 1 week  Hyponatremia, improved -Mild and sodium is now improved to 130 with -C/w IVF with normal saline at 75 MLS per hour while hospitalized -Continue to monitor replete as necessary -Repeat CMP within 1 week  Hyperglycemia -Likely reactive but will need to rule out diabetes -Check hemoglobin A1c is 5.2.  -Blood sugars are ranging from 98-135 on daily BMP/CMP's -Continue to monitor and trend blood sugars carefully and if necessary will place on sensitive NovoLog sliding scale insulin AC  Tension Headache, improved -Given acetaminophen without improvement -We will try Fioricet, improved -Has ketorolac 50 mg IV every 6 hours as needed for severe pain  Tobacco Abuse -Smokes half a pack per day -Does not want to use nicotine patches -Smoking cessation counseling given  Normocytic Anemia 2/2 to Iron Deficiency Anemia, Stable -Hemoglobin/hematocrit went from 11.9/35.1 on admission is trended down to 10.4/31.3 and is now 10.8/33.2 -question dilutional drop in the setting of IV fluid resuscitation but may have a component of underlying iron deficiency anemia -Check anemia panel showed iron level of 15, U IBC 179, TIBC 194, saturation ratios of 8%, ferritin level 181, folate level 10.3, vitamin B12 level 765 -Continue to monitor for signs and symptoms of bleeding; currently no overt bleeding noted -Repeat CBC within 1 week  Oral Fever Blister -Will treat with Valtrex 1 gram po BID x 7 days  Discharge Instructions  Discharge Instructions    Call MD for:  difficulty breathing, headache or visual disturbances   Complete by: As directed    Call MD for:  extreme fatigue   Complete by: As directed    Call MD for:  hives   Complete by: As directed    Call MD for:  persistant dizziness or light-headedness   Complete by: As directed    Call MD for:  persistant nausea and vomiting    Complete by: As directed    Call MD for:  redness, tenderness, or signs of infection (pain, swelling, redness, odor or green/yellow discharge around incision site)   Complete by: As directed    Call MD for:  severe uncontrolled pain   Complete by: As directed    Call MD for:  temperature >100.4   Complete by: As directed    Diet - low sodium heart healthy   Complete by: As directed    Discharge instructions   Complete by: As directed    You were cared for by a hospitalist during your hospital stay. If you have any questions about your discharge medications or the care you received while you were in the hospital after you are discharged, you can call the unit and ask to speak with the hospitalist on call if the hospitalist that took care of you is not available. Once you are discharged, your primary care physician will handle any further medical issues. Please note that NO REFILLS for any discharge medications will be authorized once you are discharged, as it is imperative that you return to your primary care physician (or establish a relationship with a primary care physician if you do not have one) for your aftercare needs so that they can reassess your need for medications and monitor your lab values.  Follow up with PCP within 1 week. Take all medications as prescribed. If symptoms change or worsen please return to  the ED for evaluation   Increase activity slowly   Complete by: As directed      Allergies as of 11/21/2019   No Known Allergies     Medication List    STOP taking these medications   fluconazole 150 MG tablet Commonly known as: Diflucan   ibuprofen 200 MG tablet Commonly known as: ADVIL     TAKE these medications   acetaminophen 325 MG tablet Commonly known as: TYLENOL Take 2 tablets (650 mg total) by mouth every 6 (six) hours as needed for mild pain (or Fever >/= 101).   cephALEXin 500 MG capsule Commonly known as: KEFLEX Take 1 capsule (500 mg total) by mouth  4 (four) times daily for 8 days. What changed: when to take this   iron polysaccharides 150 MG capsule Commonly known as: NIFEREX Take 1 capsule (150 mg total) by mouth daily. Start taking on: November 22, 2019   ondansetron 4 MG tablet Commonly known as: ZOFRAN Take 1 tablet (4 mg total) by mouth every 6 (six) hours as needed for nausea. What changed:   when to take this  reasons to take this   valACYclovir 1000 MG tablet Commonly known as: VALTREX Take 1 tablet (1,000 mg total) by mouth 2 (two) times daily for 7 days.       No Known Allergies  Consultations:  None  Procedures/Studies: US Renal  Result Date: 11/19/2019 CLINICAL DATA:  Pyelonephritis EXAM: RENAL / URINARY TRACT ULTRASOUND COMPLETE COMPARISON:  None. FINDINGS: Right Kidney: Renal measurements: 12.4 x 4.2 x 6.6 cm = volume: 181 mL. Echogenicity within normal limits. No mass or hydronephrosis visualized. Left Kidney: Renal measurements: 12.2 x 5.4 x 5.7 cm = volume: 197 mL. Echogenicity within normal limits. No mass or hydronephrosis visualized. Bladder: Echogenic debris within the urinary bladder. Other: None. IMPRESSION: 1. Normal ultrasound appearance of the kidneys.  No hydronephrosis. 2. Echogenic debris within the urinary bladder. Correlate with urinalysis. Electronically Signed   By: Lauralyn Primes M.D.   On: 11/19/2019 08:41     Subjective: Seen and examined at bedside and she is doing much better and had no pain today.  Had a fever blister and states this is first time this is happened.  No chest pain, lightheadedness or dizziness.  Ready for discharge as her sepsis physiology has normalized.  Discharge Exam: Vitals:   11/20/19 1938 11/21/19 0601  BP: 129/90 131/82  Pulse: (!) 59 (!) 52  Resp: 15 20  Temp: 99 F (37.2 C) 98.2 F (36.8 C)  SpO2: 100% 100%   Vitals:   11/20/19 0204 11/20/19 1413 11/20/19 1938 11/21/19 0601  BP: 101/75 (!) 126/91 129/90 131/82  Pulse: 71 67 (!) 59 (!) 52  Resp:  15 18 15 20   Temp: 98.9 F (37.2 C) 99 F (37.2 C) 99 F (37.2 C) 98.2 F (36.8 C)  TempSrc: Oral Oral Oral Oral  SpO2: 100% 100% 100% 100%  Weight:      Height:       General: Pt is alert, awake, not in acute distress Cardiovascular: RRR, S1/S2 +, no rubs, no gallops Respiratory: CTA bilaterally, no wheezing, no rhonchi Abdominal: Soft, NT, slightly diminished, bowel sounds + Extremities: no edema, no cyanosis  The results of significant diagnostics from this hospitalization (including imaging, microbiology, ancillary and laboratory) are listed below for reference.    Microbiology: Recent Results (from the past 240 hour(s))  Urine culture     Status: Abnormal   Collection Time: 11/18/19  7:13 PM   Specimen: Urine, Random  Result Value Ref Range Status   Specimen Description   Final    URINE, RANDOM Performed at [facility], [address] [place], [place] [address]    Special Requests   Final    NONE Performed at [facility], [address] [place], [place] [address]    Culture >=100,000 COLONIES/mL ESCHERICHIA COLI (A)  Final   Report Status 11/20/2019 FINAL  Final   Organism ID, Bacteria ESCHERICHIA COLI (A)  Final      Susceptibility   Escherichia coli - MIC*    AMPICILLIN 4 SENSITIVE Sensitive     CEFAZOLIN <=4 SENSITIVE Sensitive     CEFEPIME <=0.12 SENSITIVE Sensitive     CEFTRIAXONE <=0.25 SENSITIVE Sensitive     CIPROFLOXACIN <=0.25 SENSITIVE Sensitive     GENTAMICIN <=1 SENSITIVE Sensitive     IMIPENEM <=0.25 SENSITIVE Sensitive     NITROFURANTOIN <=16 SENSITIVE Sensitive     TRIMETH/SULFA <=20 SENSITIVE Sensitive     AMPICILLIN/SULBACTAM <=2 SENSITIVE Sensitive     PIP/TAZO <=4 SENSITIVE Sensitive     * >=100,000 COLONIES/mL ESCHERICHIA COLI  Culture, blood (routine x 2)     Status: None (Preliminary result)   Collection Time: 11/19/19  7:50 AM   Specimen: BLOOD  Result Value Ref Range Status   Specimen Description   Final     BLOOD RIGHT ANTECUBITAL Performed at [facility], [address], [place], [place] [address]    Special Requests   Final    BOTTLES DRAWN AEROBIC AND ANAEROBIC Blood Culture adequate volume Performed at [facility], [address] [place], [place] [address]    Culture   Final    NO GROWTH 1 DAY Performed at [facility], [address], [place], [place] [address]    Report Status PENDING  Incomplete  Culture, blood (routine x 2)     Status: None (Preliminary result)   Collection Time: 11/19/19  8:00 AM   Specimen: BLOOD RIGHT FOREARM  Result Value Ref Range Status   Specimen Description   Final    BLOOD RIGHT FOREARM Performed at [facility], [address] [place], [place] [address]    Special Requests   Final    BOTTLES DRAWN AEROBIC AND ANAEROBIC Blood Culture adequate volume Performed at [facility], [address] [place], [place] [address]    Culture   Final    NO GROWTH 1 DAY Performed at [facility], [address], [place], [place] [address]    Report Status PENDING  Incomplete  Respiratory Panel by RT PCR (Flu A&B, Covid) - Nasopharyngeal Swab     Status: None   Collection Time: 11/19/19  8:00 AM   Specimen: Nasopharyngeal Swab  Result Value Ref Range Status   SARS Coronavirus 2 by RT PCR NEGATIVE NEGATIVE Final    Comment: (NOTE) SARS-CoV-2 target nucleic acids are NOT DETECTED.  The SARS-CoV-2 RNA is generally detectable in upper respiratoy specimens during the acute phase of infection. The lowest concentration of SARS-CoV-2 viral copies this assay can detect is 131 copies/mL. A negative result does not preclude SARS-Cov-2 infection and should not be used as the sole basis for treatment or other patient management decisions. A negative result may occur with  improper specimen collection/handling, submission of specimen other than nasopharyngeal swab, presence of viral mutation(s) within the areas  targeted by this assay, and inadequate number of viral copies (<131 copies/mL). A negative result must be combined with clinical observations, patient history, and epidemiological information. The expected result is Negative.  Fact Sheet for Patients:  https://www.moore.com/  Fact Sheet for Healthcare Providers:  https://www.young.biz/  This test is no t yet approved or cleared by the [place] FDA and  has been authorized for detection and/or diagnosis of SARS-CoV-2 by FDA under an Emergency Use Authorization (EUA). This EUA will remain  in effect (meaning this test can be used) for the duration of the COVID-19 declaration under Section 564(b)(1) of the Act, 21 U.S.C. section 360bbb-3(b)(1), unless the authorization is terminated or revoked sooner.     Influenza A by PCR NEGATIVE NEGATIVE Final   Influenza B by PCR NEGATIVE NEGATIVE Final    Comment: (NOTE) The Xpert Xpress SARS-CoV-2/FLU/RSV assay is intended as an aid in  the diagnosis of influenza from Nasopharyngeal swab specimens and  should not be used as a sole basis for treatment. Nasal washings and  aspirates are unacceptable for Xpert Xpress SARS-CoV-2/FLU/RSV  testing.  Fact Sheet for Patients: https://www.moore.com/  Fact Sheet for Healthcare Providers: https://www.young.biz/  This test is not yet approved or cleared by the [place] FDA and  has been authorized for detection and/or diagnosis of SARS-CoV-2 by  FDA under an Emergency Use Authorization (EUA). This EUA will remain  in effect (meaning this test can be used) for the duration of the  Covid-19 declaration under Section 564(b)(1) of the Act, 21  U.S.C. section 360bbb-3(b)(1), unless the authorization is  terminated or revoked. Performed at [facility], [address] [place], [place] [address]     Labs: BNP (last 3 results) No results for input(s): BNP  in the last 8760 hours. Basic Metabolic Panel: Recent Labs  Lab 11/18/19 1913 11/19/19 0749 11/20/19 0413 11/21/19 0359  NA 135 131* 133* 139  K 3.3* 2.9* 3.4* 4.0  CL 100 99 102 106  CO2 GLUCOSE 117* 98 135* 94  BUN CREATININE 0.73 0.67 0.61 0.59  CALCIUM 8.9 8.2* 8.2* 8.6*  MG  --   --  2.0 1.8  PHOS  --   --  1.9* 3.2   Liver Function Tests: Recent Labs  Lab 11/18/19 1913 11/19/19 0749 11/21/19 0359  AST ALT ALKPHOS 73 89 102  BILITOT 0.9 0.8 0.3  PROT 7.5 7.1 6.5  ALBUMIN 3.7 3.3* 3.0*   No results for input(s): LIPASE, AMYLASE in the last 168 hours. No results for input(s): AMMONIA in the last 168 hours. CBC: Recent Labs  Lab 11/18/19 1913 11/19/19 0749 11/20/19 0413 11/21/19 0359  WBC 19.9* 18.4* 12.9* 8.6  NEUTROABS 17.5* 16.4*  --  5.7  HGB 11.9* 11.0* 10.4* 10.8*  HCT 35.1* 32.4* 31.3* 33.2*  MCV 91.6 91.3 93.7 95.1  PLT 206 173 168 207   Cardiac Enzymes: No results for input(s): CKTOTAL, CKMB, CKMBINDEX, TROPONINI in the last 168 hours. BNP: Invalid input(s): POCBNP CBG: No results for input(s): GLUCAP in the last 168 hours. D-Dimer No results for input(s): DDIMER in the last 72 hours. Hgb A1c Recent Labs    11/21/19 0359  HGBA1C 5.2   Lipid Profile No results for input(s): CHOL, HDL, LDLCALC, TRIG, CHOLHDL, LDLDIRECT in the last 72 hours. Thyroid function studies No results for input(s): TSH, T4TOTAL, T3FREE, THYROIDAB in the last 72  hours.  Invalid input(s): FREET3 Anemia work up Recent Labs    11/21/19 0359  VITAMINB12 765  FOLATE 10.3  FERRITIN 181  TIBC 194*  IRON 15*  RETICCTPCT 0.9   Urinalysis    Component Value Date/Time   COLORURINE YELLOW 11/18/2019 1913   APPEARANCEUR CLOUDY (A) 11/18/2019 1913   LABSPEC 1.020 11/18/2019 1913   PHURINE 6.0 11/18/2019 1913   GLUCOSEU NEGATIVE 11/18/2019 1913   HGBUR MODERATE (A) 11/18/2019 1913   BILIRUBINUR NEGATIVE 11/18/2019 1913    BILIRUBINUR small (A) 08/19/2019 1653   KETONESUR >80 (A) 11/18/2019 1913   PROTEINUR 30 (A) 11/18/2019 1913   UROBILINOGEN 0.2 08/19/2019 1653   UROBILINOGEN 0.2 11/14/2018 1310   NITRITE POSITIVE (A) 11/18/2019 1913   LEUKOCYTESUR LARGE (A) 11/18/2019 1913   Sepsis Labs Invalid input(s): PROCALCITONIN,  WBC,  LACTICIDVEN Microbiology Recent Results (from the past 240 hour(s))  Urine culture     Status: Abnormal   Collection Time: 11/18/19  7:13 PM   Specimen: Urine, Random  Result Value Ref Range Status   Specimen Description   Final    URINE, RANDOM Performed at [facility], [address] [place], [place] [address]    Special Requests   Final    NONE Performed at [facility], [address] [place], [place] [address]    Culture >=100,000 COLONIES/mL ESCHERICHIA COLI (A)  Final   Report Status 11/20/2019 FINAL  Final   Organism ID, Bacteria ESCHERICHIA COLI (A)  Final      Susceptibility   Escherichia coli - MIC*    AMPICILLIN 4 SENSITIVE Sensitive     CEFAZOLIN <=4 SENSITIVE Sensitive     CEFEPIME <=0.12 SENSITIVE Sensitive     CEFTRIAXONE <=0.25 SENSITIVE Sensitive     CIPROFLOXACIN <=0.25 SENSITIVE Sensitive     GENTAMICIN <=1 SENSITIVE Sensitive     IMIPENEM <=0.25 SENSITIVE Sensitive     NITROFURANTOIN <=16 SENSITIVE Sensitive     TRIMETH/SULFA <=20 SENSITIVE Sensitive     AMPICILLIN/SULBACTAM <=2 SENSITIVE Sensitive     PIP/TAZO <=4 SENSITIVE Sensitive     * >=100,000 COLONIES/mL ESCHERICHIA COLI  Culture, blood (routine x 2)     Status: None (Preliminary result)   Collection Time: 11/19/19  7:50 AM   Specimen: BLOOD  Result Value Ref Range Status   Specimen Description   Final    BLOOD RIGHT ANTECUBITAL Performed at [facility], [address], [place], [place] [address]    Special Requests   Final    BOTTLES DRAWN AEROBIC AND ANAEROBIC Blood Culture adequate volume Performed at [facility], [address] [place], [place] [address]    Culture   Final    NO GROWTH 1 DAY Performed at [facility], [address], [place], [place] [address]    Report Status PENDING  Incomplete  Culture, blood (routine x 2)     Status: None (Preliminary result)   Collection Time: 11/19/19  8:00 AM   Specimen: BLOOD RIGHT FOREARM  Result Value Ref Range Status   Specimen Description   Final    BLOOD RIGHT FOREARM Performed at [facility], [address] [place], [place] [address]    Special Requests   Final    BOTTLES DRAWN AEROBIC AND ANAEROBIC Blood Culture adequate volume Performed at [facility], [address], [place], [place] [address]    Culture   Final  NO GROWTH 1 DAY Performed at [facility], [address] [place], [place] [address]    Report Status PENDING  Incomplete  Respiratory Panel by RT PCR (Flu A&B, Covid) - Nasopharyngeal Swab     Status: None   Collection Time: 11/19/19  8:00 AM   Specimen: Nasopharyngeal Swab  Result Value Ref Range Status   SARS Coronavirus 2 by RT PCR NEGATIVE NEGATIVE Final    Comment: (NOTE) SARS-CoV-2 target nucleic acids are NOT DETECTED.  The SARS-CoV-2 RNA is generally detectable in upper respiratoy specimens during the acute phase of infection. The lowest concentration of SARS-CoV-2 viral copies this assay can detect is 131 copies/mL. A negative result does not preclude SARS-Cov-2 infection and should not be used as the sole basis for treatment or other patient management decisions. A negative result may occur with  improper specimen collection/handling, submission of specimen other than nasopharyngeal swab, presence of viral mutation(s) within the areas targeted by this assay, and inadequate number of viral copies (<131 copies/mL). A negative result must be combined with clinical observations, patient history, and epidemiological information. The expected result is Negative.  Fact Sheet for Patients:   https://www.moore.com/  Fact Sheet for Healthcare Providers:  https://www.young.biz/  This test is no t yet approved or cleared by the [place] FDA and  has been authorized for detection and/or diagnosis of SARS-CoV-2 by FDA under an Emergency Use Authorization (EUA). This EUA will remain  in effect (meaning this test can be used) for the duration of the COVID-19 declaration under Section 564(b)(1) of the Act, 21 U.S.C. section 360bbb-3(b)(1), unless the authorization is terminated or revoked sooner.     Influenza A by PCR NEGATIVE NEGATIVE Final   Influenza B by PCR NEGATIVE NEGATIVE Final    Comment: (NOTE) The Xpert Xpress SARS-CoV-2/FLU/RSV assay is intended as an aid in  the diagnosis of influenza from Nasopharyngeal swab specimens and  should not be used as a sole basis for treatment. Nasal washings and  aspirates are unacceptable for Xpert Xpress SARS-CoV-2/FLU/RSV  testing.  Fact Sheet for Patients: https://www.moore.com/  Fact Sheet for Healthcare Providers: https://www.young.biz/  This test is not yet approved or cleared by the [place] FDA and  has been authorized for detection and/or diagnosis of SARS-CoV-2 by  FDA under an Emergency Use Authorization (EUA). This EUA will remain  in effect (meaning this test can be used) for the duration of the  Covid-19 declaration under Section 564(b)(1) of the Act, 21  U.S.C. section 360bbb-3(b)(1), unless the authorization is  terminated or revoked. Performed at [facility], [address] [place], [place] [address]    Time coordinating discharge: 35 minutes  SIGNED:  Merlene Laughter, DO [facility] 11/21/2019, 10:56 AM Pager is on AMION  If 7PM-7AM, please contact night-coverage [email or web address]

## 2019-11-21 NOTE — TOC Transition Note (Signed)
Transition of Care [facility]) - CM/SW Discharge Note   Patient Details  Name: Katie Glover MRN: [id number] Date of Birth: July 22, 1985  [facility] of [facility] CM/SW Contact:  Ida Rogue, LCSW Phone Number: 11/21/2019, 11:08 AM   Clinical Narrative:   Patient in need of PCP is agreeable to follow up at [facility]. Appointment obtained.  No further needs identifed.  TOC sign off.    Final next level of care: Home/Self Care Barriers to Discharge: No Barriers Identified   Patient Goals and CMS Choice        Discharge Placement                       Discharge Plan and Services                                     Social Determinants of Health (SDOH) Interventions     Readmission Risk Interventions No flowsheet data found.

## 2019-11-24 LAB — CULTURE, BLOOD (ROUTINE X 2)
Culture: NO GROWTH
Culture: NO GROWTH
Special Requests: ADEQUATE
Special Requests: ADEQUATE

## 2019-11-25 ENCOUNTER — Other Ambulatory Visit: Payer: Self-pay

## 2019-11-25 ENCOUNTER — Encounter: Payer: Self-pay | Admitting: Nurse Practitioner

## 2019-11-25 ENCOUNTER — Ambulatory Visit (INDEPENDENT_AMBULATORY_CARE_PROVIDER_SITE_OTHER): Payer: Medicaid Other | Admitting: Nurse Practitioner

## 2019-11-25 VITALS — BP 105/71 | HR 62 | Temp 98.0°F | Resp 18 | Ht 62.5 in | Wt 120.0 lb

## 2019-11-25 DIAGNOSIS — N12 Tubulo-interstitial nephritis, not specified as acute or chronic: Secondary | ICD-10-CM

## 2019-11-25 DIAGNOSIS — E876 Hypokalemia: Secondary | ICD-10-CM | POA: Diagnosis not present

## 2019-11-25 DIAGNOSIS — Z7689 Persons encountering health services in other specified circumstances: Secondary | ICD-10-CM | POA: Diagnosis not present

## 2019-11-25 DIAGNOSIS — Z3009 Encounter for other general counseling and advice on contraception: Secondary | ICD-10-CM

## 2019-11-25 NOTE — Patient Instructions (Signed)
Health Maintenance, Female Adopting a healthy lifestyle and getting preventive care are important in promoting health and wellness. Ask your health care provider about:  The right schedule for you to have regular tests and exams.  Things you can do on your own to prevent diseases and keep yourself healthy. What should I know about diet, weight, and exercise? Eat a healthy diet   Eat a diet that includes plenty of vegetables, fruits, low-fat dairy products, and lean protein.  Do not eat a lot of foods that are high in solid fats, added sugars, or sodium. Maintain a healthy weight Body mass index (BMI) is used to identify weight problems. It estimates body fat based on height and weight. Your health care provider can help determine your BMI and help you achieve or maintain a healthy weight. Get regular exercise Get regular exercise. This is one of the most important things you can do for your health. Most adults should:  Exercise for at least 150 minutes each week. The exercise should increase your heart rate and make you sweat (moderate-intensity exercise).  Do strengthening exercises at least twice a week. This is in addition to the moderate-intensity exercise.  Spend less time sitting. Even light physical activity can be beneficial. Watch cholesterol and blood lipids Have your blood tested for lipids and cholesterol at 35 years of age, then have this test every 5 years. Have your cholesterol levels checked more often if:  Your lipid or cholesterol levels are high.  You are older than 35 years of age.  You are at high risk for heart disease. What should I know about cancer screening? Depending on your health history and family history, you may need to have cancer screening at various ages. This may include screening for:  Breast cancer.  Cervical cancer.  Colorectal cancer.  Skin cancer.  Lung cancer. What should I know about heart disease, diabetes, and high blood  pressure? Blood pressure and heart disease  High blood pressure causes heart disease and increases the risk of stroke. This is more likely to develop in people who have high blood pressure readings, are of African descent, or are overweight.  Have your blood pressure checked: ? Every 3-5 years if you are 18-39 years of age. ? Every year if you are 40 years old or older. Diabetes Have regular diabetes screenings. This checks your fasting blood sugar level. Have the screening done:  Once every three years after age 40 if you are at a normal weight and have a low risk for diabetes.  More often and at a younger age if you are overweight or have a high risk for diabetes. What should I know about preventing infection? Hepatitis B If you have a higher risk for hepatitis B, you should be screened for this virus. Talk with your health care provider to find out if you are at risk for hepatitis B infection. Hepatitis C Testing is recommended for:  Everyone born from 1945 through 1965.  Anyone with known risk factors for hepatitis C. Sexually transmitted infections (STIs)  Get screened for STIs, including gonorrhea and chlamydia, if: ? You are sexually active and are younger than 35 years of age. ? You are older than 35 years of age and your health care provider tells you that you are at risk for this type of infection. ? Your sexual activity has changed since you were last screened, and you are at increased risk for chlamydia or gonorrhea. Ask your health care provider if   you are at risk.  Ask your health care provider about whether you are at high risk for HIV. Your health care provider may recommend a prescription medicine to help prevent HIV infection. If you choose to take medicine to prevent HIV, you should first get tested for HIV. You should then be tested every 3 months for as long as you are taking the medicine. Pregnancy  If you are about to stop having your period (premenopausal) and  you may become pregnant, seek counseling before you get pregnant.  Take 400 to 800 micrograms (mcg) of folic acid every day if you become pregnant.  Ask for birth control (contraception) if you want to prevent pregnancy. Osteoporosis and menopause Osteoporosis is a disease in which the bones lose minerals and strength with aging. This can result in bone fractures. If you are 65 years old or older, or if you are at risk for osteoporosis and fractures, ask your health care provider if you should:  Be screened for bone loss.  Take a calcium or vitamin D supplement to lower your risk of fractures.  Be given hormone replacement therapy (HRT) to treat symptoms of menopause. Follow these instructions at home: Lifestyle  Do not use any products that contain nicotine or tobacco, such as cigarettes, e-cigarettes, and chewing tobacco. If you need help quitting, ask your health care provider.  Do not use street drugs.  Do not share needles.  Ask your health care provider for help if you need support or information about quitting drugs. Alcohol use  Do not drink alcohol if: ? Your health care provider tells you not to drink. ? You are pregnant, may be pregnant, or are planning to become pregnant.  If you drink alcohol: ? Limit how much you use to 0-1 drink a day. ? Limit intake if you are breastfeeding.  Be aware of how much alcohol is in your drink. In the [place] one drink equals one 12 oz bottle of beer (355 mL), one 5 oz glass of wine (148 mL), or one 1 oz glass of hard liquor (44 mL). General instructions  Schedule regular health, dental, and eye exams.  Stay current with your vaccines.  Tell your health care provider if: ? You often feel depressed. ? You have ever been abused or do not feel safe at home. Summary  Adopting a healthy lifestyle and getting preventive care are important in promoting health and wellness.  Follow your health care provider's instructions about healthy  diet, exercising, and getting tested or screened for diseases.  Follow your health care provider's instructions on monitoring your cholesterol and blood pressure. This information is not intended to replace advice given to you by your health care provider. Make sure you discuss any questions you have with your health care provider. Document Revised: 12/13/2017 Document Reviewed: 12/13/2017 Elsevier Patient Education  2020 Elsevier Inc.  

## 2019-11-25 NOTE — Progress Notes (Signed)
[facility] Patient [facility] [address] [place], [place]  [address] Phone:  [phone]   Fax:  [phone]   New Patient Office Visit  Subjective:  Patient ID: Katie Glover, female    DOB: 1985-12-16  Age: 34 y.o. MRN: [id number]  CC:  Chief Complaint  Patient presents with  . Establish Care    HPI Katie Glover presents to establish care. 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  Patient is in today for hospital follow-up. Recently admitted for pyelonephritis and sepsis.  Hospital course of treatment was from 11/19/19 to 11/21/19.  During hospital course she received IVF and anbx Rocephin and ancef. Renal US negative. She admits that she is prone to UTI . She was drinking a lot of soda. She has had recurrent UTI 3 over the last year.  Denies headache, dizziness, visual changes, shortness of breath, dyspnea on exertion, chest pain, nausea, vomiting or any edema.     Past Medical History:  Diagnosis Date  . Narcotic abuse in remission [facility])     Past Surgical History:  Procedure Laterality Date  . CHOLECYSTECTOMY  11/19/2010   Procedure: LAPAROSCOPIC CHOLECYSTECTOMY;  Surgeon: Shelly Rubenstein, MD;  Location: [facility] OR;  Service: General;  Laterality: N/A;  . TUBAL LIGATION      Family History  Problem Relation Age of Onset  . Kidney cancer Other   . Lung cancer Other   . Healthy Mother   . Healthy Father     Social History   Socioeconomic History  . Marital status: Single    Spouse name: Not on file  . Number of children: Not on file  . Years of education: Not on file  . Highest education level: Not on file  Occupational History  . Not on file  Tobacco Use  . Smoking status: Current Every Day Smoker    Packs/day: 0.50    Years: 9.00    Pack years: 4.50    Types: Cigarettes  . Smokeless tobacco: Never Used  Vaping Use  . Vaping Use: Never used  Substance and Sexual Activity  . Alcohol use: Yes    Comment:  OCCASIONAL  . Drug use: Not Currently    Types: Marijuana    Comment: pain medication, pt in recovery  . Sexual activity: Yes    Birth control/protection: Injection, Surgical  Other Topics Concern  . Not on file  Social History Narrative  . Not on file   Social Determinants of Health   Financial Resource Strain:   . Difficulty of Paying Living Expenses: Not on file  Food Insecurity:   . Worried About Programme researcher, broadcasting/film/video in the Last Year: Not on file  . Ran Out of Food in the Last Year: Not on file  Transportation Needs:   . Lack of Transportation (Medical): Not on file  . Lack of Transportation (Non-Medical): Not on file  Physical Activity:   . Days of Exercise per Week: Not on file  . Minutes of Exercise per Session: Not on file  Stress:   . Feeling of Stress : Not on file  Social Connections:   . Frequency of Communication with Friends and Family: Not on file  . Frequency of Social Gatherings with Friends and Family: Not on file  . Attends Religious Services: Not on file  . Active Member of Clubs or Organizations: Not on file  . Attends Banker Meetings: Not on file  .  Marital Status: Not on file  Intimate Partner Violence:   . Fear of Current or Ex-Partner: Not on file  . Emotionally Abused: Not on file  . Physically Abused: Not on file  . Sexually Abused: Not on file    ROS Review of Systems  Objective:   Today's Vitals: BP 105/71 (BP Location: Right Arm, Patient Position: Sitting, Cuff Size: Normal)   Pulse 62   Temp 98 F (36.7 C)   Resp 18   Ht 5' 2.5" (1.588 m)   Wt 120 lb (54.4 kg)   SpO2 100%   BMI 21.60 kg/m   Physical Exam Constitutional:      Appearance: She is normal weight.  HENT:     Head: Normocephalic and atraumatic.     Nose: Nose normal.     Mouth/Throat:     Mouth: Mucous membranes are moist.  Cardiovascular:     Rate and Rhythm: Normal rate and regular rhythm.     Pulses: Normal pulses.     Heart sounds: Normal  heart sounds.  Pulmonary:     Effort: Pulmonary effort is normal.     Breath sounds: Normal breath sounds.  Abdominal:     General: Abdomen is flat. Bowel sounds are normal.     Palpations: Abdomen is soft.  Musculoskeletal:        General: Normal range of motion.     Cervical back: Normal range of motion.  Skin:    General: Skin is warm.     Capillary Refill: Capillary refill takes less than 2 seconds.  Neurological:     General: No focal deficit present.     Mental Status: She is alert and oriented to person, place, and time.  Psychiatric:        Mood and Affect: Mood normal.        Behavior: Behavior normal.        Thought Content: Thought content normal.        Judgment: Judgment normal.     Assessment & Plan:   Problem List Items Addressed This Visit      Genitourinary   Pyelonephritis - Primary Continue with current treatment  Will follow up in one week post anbx tx for reevaluation and to have labs repeated for evaluation of hypokalemia. Mag and Phos per recommendation Encourage completion of anbx even when symptoms improve.  Discussed resistance with anbx overuse Discussed allergic reactions with anbx.  Encourage increasing hydration with water and how to tell when this is achieved Add cranberry juice 100% 8-16 ozs daily until symptoms improve Discussed hygiene, avoid not voiding when the urge presents     Other   Hypokalemia    Other Visit Diagnoses    Encounter to establish care     Discussed health maintenance  Patient education material provided      Outpatient Encounter Medications as of 11/25/2019  Medication Sig  . acetaminophen (TYLENOL) 325 MG tablet Take 2 tablets (650 mg total) by mouth every 6 (six) hours as needed for mild pain (or Fever >/= 101).  . cephALEXin (KEFLEX) 500 MG capsule Take 1 capsule (500 mg total) by mouth 4 (four) times daily for 8 days.  . iron polysaccharides (NIFEREX) 150 MG capsule Take 1 capsule (150 mg total) by mouth  daily.  . ondansetron (ZOFRAN) 4 MG tablet Take 1 tablet (4 mg total) by mouth every 6 (six) hours as needed for nausea.  . valACYclovir (VALTREX) 1000 MG tablet Take 1 tablet (1,000 mg  total) by mouth 2 (two) times daily for 7 days.   No facility-administered encounter medications on file as of 11/25/2019.    Follow-up: Return for on Dec 01 .   Barbette Merino, NP

## 2019-12-05 ENCOUNTER — Ambulatory Visit (INDEPENDENT_AMBULATORY_CARE_PROVIDER_SITE_OTHER): Payer: Medicaid Other | Admitting: Nurse Practitioner

## 2019-12-05 ENCOUNTER — Other Ambulatory Visit: Payer: Self-pay

## 2019-12-05 DIAGNOSIS — E876 Hypokalemia: Secondary | ICD-10-CM | POA: Diagnosis not present

## 2019-12-05 DIAGNOSIS — Z13220 Encounter for screening for lipoid disorders: Secondary | ICD-10-CM

## 2019-12-05 DIAGNOSIS — Z3042 Encounter for surveillance of injectable contraceptive: Secondary | ICD-10-CM

## 2019-12-05 DIAGNOSIS — N12 Tubulo-interstitial nephritis, not specified as acute or chronic: Secondary | ICD-10-CM | POA: Diagnosis not present

## 2019-12-05 LAB — POCT URINALYSIS DIPSTICK
Bilirubin, UA: NEGATIVE
Blood, UA: NEGATIVE
Glucose, UA: NEGATIVE
Ketones, UA: NEGATIVE
Leukocytes, UA: NEGATIVE
Nitrite, UA: NEGATIVE
Protein, UA: NEGATIVE
Spec Grav, UA: 1.025 (ref 1.010–1.025)
Urobilinogen, UA: 0.2 E.U./dL
pH, UA: 7 (ref 5.0–8.0)

## 2019-12-05 LAB — POCT URINE PREGNANCY: Preg Test, Ur: NEGATIVE

## 2019-12-05 MED ORDER — MEDROXYPROGESTERONE ACETATE 150 MG/ML IM SUSP: 150.0000 mg | Freq: Once | INTRAMUSCULAR | Status: DC

## 2019-12-05 MED ORDER — MEDROXYPROGESTERONE ACETATE 150 MG/ML IM SUSY
150.0000 mg | PREFILLED_SYRINGE | Freq: Once | INTRAMUSCULAR | Status: AC
  Administered 2019-12-05: 150 mg via INTRAMUSCULAR

## 2019-12-05 NOTE — Progress Notes (Signed)
[facility] Patient [facility] [address] [place], [place]  [address] Phone:  [phone]   Fax:  [phone]   Established Patient Office Visit  Subjective:  Patient ID: Katie Katie Glover, female    DOB: 05-26-1985  Age: 34 y.o. MRN: [id number]  CC: No chief complaint on file.   HPI GRACYN Katie Glover presents for follow up. 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   Urinary Tract Infection Patient is into for reevaluation of pyelonephritis  . She has had no symptoms.  Contraception Counseling Patient presents for contraception counseling. The patient has no complaints today. The patient is sexually active. Pertinent past medical history: urinary tract infections. She has been on Depo-Provera in the past.  She would like to restart this.     Past Medical History:  Diagnosis Date  . Narcotic abuse in remission [facility])     Past Surgical History:  Procedure Laterality Date  . CHOLECYSTECTOMY  11/19/2010   Procedure: LAPAROSCOPIC CHOLECYSTECTOMY;  Surgeon: Shelly Rubenstein, MD;  Location: [facility] OR;  Service: General;  Laterality: N/A;  . TUBAL LIGATION      Family History  Problem Relation Age of Onset  . Kidney cancer Other   . Lung cancer Other   . Healthy Mother   . Healthy Father     Social History   Socioeconomic History  . Marital status: Single    Spouse name: Not on file  . Number of children: Not on file  . Years of education: Not on file  . Highest education level: Not on file  Occupational History  . Not on file  Tobacco Use  . Smoking status: Current Every Day Smoker    Packs/day: 0.50    Years: 9.00    Pack years: 4.50    Types: Cigarettes  . Smokeless tobacco: Never Used  Vaping Use  . Vaping Use: Never used  Substance and Sexual Activity  . Alcohol use: Yes    Comment: OCCASIONAL  . Drug use: Not Currently    Types: Marijuana    Comment: pain medication, pt in recovery  . Sexual activity: Yes     Birth control/protection: Injection, Surgical  Other Topics Concern  . Not on file  Social History Narrative  . Not on file   Social Determinants of Health   Financial Resource Strain:   . Difficulty of Paying Living Expenses: Not on file  Food Insecurity:   . Worried About Programme researcher, broadcasting/film/video in the Last Year: Not on file  . Ran Out of Food in the Last Year: Not on file  Transportation Needs:   . Lack of Transportation (Medical): Not on file  . Lack of Transportation (Non-Medical): Not on file  Physical Activity:   . Days of Exercise per Week: Not on file  . Minutes of Exercise per Session: Not on file  Stress:   . Feeling of Stress : Not on file  Social Connections:   . Frequency of Communication with Friends and Family: Not on file  . Frequency of Social Gatherings with Friends and Family: Not on file  . Attends Religious Services: Not on file  . Active Member of Clubs or Organizations: Not on file  . Attends Banker Meetings: Not on file  . Marital Status: Not on file  Intimate Partner Violence:   . Fear of Current or Ex-Partner: Not on file  . Emotionally Abused: Not on file  .  Physically Abused: Not on file  . Sexually Abused: Not on file    Outpatient Medications Prior to Visit  Medication Sig Dispense Refill  . acetaminophen (TYLENOL) 325 MG tablet Take 2 tablets (650 mg total) by mouth every 6 (six) hours as needed for mild pain (or Fever >/= 101). 20 tablet 0  . iron polysaccharides (NIFEREX) 150 MG capsule Take 1 capsule (150 mg total) by mouth daily. 30 capsule 0  . ondansetron (ZOFRAN) 4 MG tablet Take 1 tablet (4 mg total) by mouth every 6 (six) hours as needed for nausea. 20 tablet 0   No facility-administered medications prior to visit.    No Known Allergies  ROS Review of Systems    Objective:    Physical Exam Constitutional:      General: She is not in acute distress.    Appearance: She is not ill-appearing, toxic-appearing or  diaphoretic.  HENT:     Nose: Nose normal.     Mouth/Throat:     Mouth: Mucous membranes are moist.     Pharynx: Oropharynx is clear.  Cardiovascular:     Rate and Rhythm: Normal rate and regular rhythm.     Pulses: Normal pulses.     Heart sounds: Normal heart sounds.  Pulmonary:     Effort: Pulmonary effort is normal.     Breath sounds: Normal breath sounds.  Musculoskeletal:        General: Normal range of motion.     Cervical back: Normal range of motion.  Skin:    General: Skin is warm and dry.     Capillary Refill: Capillary refill takes less than 2 seconds.  Neurological:     General: No focal deficit present.     Mental Status: She is alert and oriented to person, place, and time.  Psychiatric:        Mood and Affect: Mood normal.        Behavior: Behavior normal.        Thought Content: Thought content normal.        Judgment: Judgment normal.     There were no vitals taken for this visit. Wt Readings from Last 3 Encounters:  11/25/19 120 lb (54.4 kg)  11/19/19 120 lb (54.4 kg)  11/18/19 120 lb (54.4 kg)     There are no preventive care reminders to display for this patient.  There are no preventive care reminders to display for this patient.  No results found for: TSH Lab Results  Component Value Date   WBC 6.1 12/05/2019   HGB 12.5 12/05/2019   HCT 37.8 12/05/2019   MCV 93 12/05/2019   PLT 385 12/05/2019   Lab Results  Component Value Date   NA 141 12/05/2019   K 4.0 12/05/2019   CO2 24 11/21/2019   GLUCOSE 82 12/05/2019   BUN 9 12/05/2019   CREATININE 0.64 12/05/2019   BILITOT 0.4 12/05/2019   ALKPHOS 99 12/05/2019   AST 36 12/05/2019   ALT 24 11/21/2019   PROT 7.0 12/05/2019   ALBUMIN 4.1 12/05/2019   CALCIUM 9.3 12/05/2019   ANIONGAP 9 11/21/2019   No results found for: CHOL No results found for: HDL No results found for: LDLCALC No results found for: TRIG No results found for: [facility] Lab Results  Component Value Date   HGBA1C  5.2 11/21/2019      Assessment & Plan:   Problem List Items Addressed This Visit      Genitourinary   Pyelonephritis -  Primary Reevaluation with UA and culture   Relevant Orders   POCT urinalysis dipstick (Completed)   Urine Culture (Completed)   CBC with Differential/Platelet (Completed)     Other   Hypokalemia Reevaluation pending continue to encourage healthy diet with variety   Relevant Orders   Comp. Metabolic Panel (12) (Completed)    Other Visit Diagnoses    Encounter for Depo-Provera contraception     To be completed after negative urine pregnancy   Relevant Medications   medroxyPROGESTERone Acetate SUSY 150 mg (Completed)   Other Relevant Orders   POCT urine pregnancy (Completed)   Screening for cholesterol level       Relevant Orders   Lipid Panel      Meds ordered this encounter  Medications  . DISCONTD: medroxyPROGESTERone (DEPO-PROVERA) injection 150 mg  . medroxyPROGESTERone Acetate SUSY 150 mg    Follow-up: Return in about 3 months (around 03/04/2020) for Depo in 3 months with fasting Lipid panel thanks.    Barbette Merino, NP

## 2019-12-06 LAB — CBC WITH DIFFERENTIAL/PLATELET
Basophils Absolute: 0 10*3/uL (ref 0.0–0.2)
Basos: 1 %
EOS (ABSOLUTE): 0.2 10*3/uL (ref 0.0–0.4)
Eos: 3 %
Hematocrit: 37.8 % (ref 34.0–46.6)
Hemoglobin: 12.5 g/dL (ref 11.1–15.9)
Immature Grans (Abs): 0 10*3/uL (ref 0.0–0.1)
Immature Granulocytes: 0 %
Lymphocytes Absolute: 2.6 10*3/uL (ref 0.7–3.1)
Lymphs: 43 %
MCH: 30.6 pg (ref 26.6–33.0)
MCHC: 33.1 g/dL (ref 31.5–35.7)
MCV: 93 fL (ref 79–97)
Monocytes Absolute: 0.4 10*3/uL (ref 0.1–0.9)
Monocytes: 6 %
Neutrophils Absolute: 2.9 10*3/uL (ref 1.4–7.0)
Neutrophils: 47 %
Platelets: 385 10*3/uL (ref 150–450)
RBC: 4.08 x10E6/uL (ref 3.77–5.28)
RDW: 12.6 % (ref 11.7–15.4)
WBC: 6.1 10*3/uL (ref 3.4–10.8)

## 2019-12-06 LAB — COMP. METABOLIC PANEL (12)
AST: 36 IU/L (ref 0–40)
Albumin/Globulin Ratio: 1.4 (ref 1.2–2.2)
Albumin: 4.1 g/dL (ref 3.8–4.8)
Alkaline Phosphatase: 99 IU/L (ref 44–121)
BUN/Creatinine Ratio: 14 (ref 9–23)
BUN: 9 mg/dL (ref 6–20)
Bilirubin Total: 0.4 mg/dL (ref 0.0–1.2)
Calcium: 9.3 mg/dL (ref 8.7–10.2)
Chloride: 103 mmol/L (ref 96–106)
Creatinine, Ser: 0.64 mg/dL (ref 0.57–1.00)
GFR calc Af Amer: 134 mL/min/{1.73_m2} (ref >59)
GFR calc non Af Amer: 116 mL/min/{1.73_m2} (ref >59)
Globulin, Total: 2.9 g/dL (ref 1.5–4.5)
Glucose: 82 mg/dL (ref 65–99)
Potassium: 4 mmol/L (ref 3.5–5.2)
Sodium: 141 mmol/L (ref 134–144)
Total Protein: 7 g/dL (ref 6.0–8.5)

## 2019-12-07 LAB — URINE CULTURE: Organism ID, Bacteria: NO GROWTH

## 2019-12-09 ENCOUNTER — Encounter: Payer: Self-pay | Admitting: Nurse Practitioner

## 2020-01-10 ENCOUNTER — Telehealth: Payer: Self-pay | Admitting: Nurse Practitioner

## 2020-01-10 NOTE — Telephone Encounter (Signed)
Yes Dee. I sent a MyChart message to the patient also. Thanks

## 2020-01-13 ENCOUNTER — Other Ambulatory Visit: Payer: Self-pay

## 2020-01-13 ENCOUNTER — Other Ambulatory Visit: Payer: Self-pay | Admitting: Nurse Practitioner

## 2020-01-13 ENCOUNTER — Other Ambulatory Visit: Payer: Medicaid Other

## 2020-01-13 DIAGNOSIS — Z862 Personal history of diseases of the blood and blood-forming organs and certain disorders involving the immune mechanism: Secondary | ICD-10-CM

## 2020-01-13 LAB — POCT URINALYSIS DIPSTICK
Bilirubin, UA: NEGATIVE
Blood, UA: NEGATIVE
Glucose, UA: NEGATIVE
Ketones, UA: NEGATIVE
Nitrite, UA: NEGATIVE
Protein, UA: NEGATIVE
Spec Grav, UA: >=1.03 — AB (ref 1.010–1.025)
Urobilinogen, UA: 0.2 E.U./dL
pH, UA: 6.5 (ref 5.0–8.0)

## 2020-01-15 LAB — URINE CULTURE

## 2020-03-04 ENCOUNTER — Ambulatory Visit (INDEPENDENT_AMBULATORY_CARE_PROVIDER_SITE_OTHER): Payer: Medicaid Other | Admitting: Nurse Practitioner

## 2020-03-04 ENCOUNTER — Other Ambulatory Visit: Payer: Self-pay

## 2020-03-04 DIAGNOSIS — Z789 Other specified health status: Secondary | ICD-10-CM

## 2020-03-04 LAB — POCT URINE PREGNANCY: Preg Test, Ur: NEGATIVE

## 2020-03-04 MED ORDER — MEDROXYPROGESTERONE ACETATE 150 MG/ML IM SUSP
150.0000 mg | Freq: Once | INTRAMUSCULAR | Status: AC
  Administered 2020-03-04: 150 mg via INTRAMUSCULAR

## 2020-05-25 ENCOUNTER — Ambulatory Visit: Payer: Medicaid Other | Admitting: Nurse Practitioner

## 2020-06-18 ENCOUNTER — Ambulatory Visit (INDEPENDENT_AMBULATORY_CARE_PROVIDER_SITE_OTHER): Payer: Medicaid Other | Admitting: Nurse Practitioner

## 2020-06-18 ENCOUNTER — Other Ambulatory Visit: Payer: Self-pay

## 2020-06-18 DIAGNOSIS — Z3042 Encounter for surveillance of injectable contraceptive: Secondary | ICD-10-CM | POA: Diagnosis not present

## 2020-06-18 LAB — POCT URINE PREGNANCY: Preg Test, Ur: NEGATIVE

## 2020-06-18 MED ORDER — MEDROXYPROGESTERONE ACETATE 150 MG/ML IM SUSP
150.0000 mg | Freq: Once | INTRAMUSCULAR | Status: AC
  Administered 2020-06-18: 150 mg via INTRAMUSCULAR

## 2020-06-21 NOTE — Progress Notes (Signed)
° °  [facility] °[address] °[place], [place]  [address] °Phone:  [phone]   Fax:  [phone] °Nurse visit °

## 2020-09-18 ENCOUNTER — Other Ambulatory Visit: Payer: Self-pay

## 2020-09-18 ENCOUNTER — Ambulatory Visit (INDEPENDENT_AMBULATORY_CARE_PROVIDER_SITE_OTHER): Payer: Medicaid Other | Admitting: Nurse Practitioner

## 2020-09-18 DIAGNOSIS — Z3042 Encounter for surveillance of injectable contraceptive: Secondary | ICD-10-CM | POA: Diagnosis not present

## 2020-09-18 LAB — POCT URINE PREGNANCY: Preg Test, Ur: NEGATIVE

## 2020-09-18 MED ORDER — MEDROXYPROGESTERONE ACETATE 150 MG/ML IM SUSP
150.0000 mg | Freq: Once | INTRAMUSCULAR | Status: AC
  Administered 2020-09-18: 150 mg via INTRAMUSCULAR

## 2020-09-30 ENCOUNTER — Encounter: Payer: Self-pay | Admitting: Nurse Practitioner

## 2020-09-30 ENCOUNTER — Ambulatory Visit (INDEPENDENT_AMBULATORY_CARE_PROVIDER_SITE_OTHER): Payer: Medicaid Other | Admitting: Nurse Practitioner

## 2020-09-30 ENCOUNTER — Other Ambulatory Visit: Payer: Self-pay

## 2020-09-30 VITALS — BP 116/70 | HR 72 | Temp 98.2°F | Ht 62.5 in | Wt 128.0 lb

## 2020-09-30 DIAGNOSIS — R109 Unspecified abdominal pain: Secondary | ICD-10-CM

## 2020-09-30 DIAGNOSIS — R82998 Other abnormal findings in urine: Secondary | ICD-10-CM | POA: Diagnosis not present

## 2020-09-30 LAB — POCT URINALYSIS DIP (CLINITEK)
Bilirubin, UA: NEGATIVE
Blood, UA: NEGATIVE
Glucose, UA: NEGATIVE mg/dL
Ketones, POC UA: NEGATIVE mg/dL
Nitrite, UA: NEGATIVE
POC PROTEIN,UA: NEGATIVE
Spec Grav, UA: 1.02 (ref 1.010–1.025)
Urobilinogen, UA: 0.2 E.U./dL
pH, UA: 6 (ref 5.0–8.0)

## 2020-09-30 MED ORDER — NITROFURANTOIN MONOHYD MACRO 100 MG PO CAPS: 100.0000 mg | ORAL_CAPSULE | Freq: Two times a day (BID) | ORAL | 0 refills | Status: AC

## 2020-09-30 NOTE — Progress Notes (Signed)
[facility] Patient [facility] [address] [place], [place]  [address] Phone:  [phone]   Fax:  [phone] Subjective:   Patient ID: Katie Glover, female    DOB: 02-29-1985, 35 y.o.   MRN: [id number]  Chief Complaint  Patient presents with   Acute Visit    Side pain, thinking she has a bladder infection like she had last November.    HPI Katie Glover 35 y.o. female with no significant medical history to [facility] for evaluation of left flank pain x 2 days. Pain worsened today. Denies any dysuria. Last year had similar symptoms, and was diagnosed and admitted for pyelonephritis. Currently concerned for possible UTI. Wanted to receive treatment before symptoms worsened. Denies any nausea/ vomiting. Currently rates pain 5/10.Describes as aching and constant. Has been taking Tylenol for pain with mild improvement in symptoms. Denies any worsening factors. Denies any fatigue, chest pain, shortness of breath, HA or dizziness. Denies any blurred vision, numbness or tingling. Denies any fever.   Past Medical History:  Diagnosis Date   Narcotic abuse in remission [facility])     Past Surgical History:  Procedure Laterality Date   CHOLECYSTECTOMY  11/19/2010   Procedure: LAPAROSCOPIC CHOLECYSTECTOMY;  Surgeon: Shelly Rubenstein, MD;  Location: [facility] OR;  Service: General;  Laterality: N/A;   TUBAL LIGATION      Family History  Problem Relation Age of Onset   Kidney cancer Other    Lung cancer Other    Healthy Mother    Healthy Father     Social History   Socioeconomic History   Marital status: Single    Spouse name: Not on file   Number of children: Not on file   Years of education: Not on file   Highest education level: Not on file  Occupational History   Not on file  Tobacco Use   Smoking status: Every Day    Packs/day: 0.50    Years: 9.00    Pack years: 4.50    Types: Cigarettes   Smokeless tobacco: Never  Vaping Use   Vaping Use: Never used   Substance and Sexual Activity   Alcohol use: Yes    Comment: OCCASIONAL   Drug use: Not Currently    Types: Marijuana    Comment: pain medication, pt in recovery   Sexual activity: Yes    Birth control/protection: Injection, Surgical  Other Topics Concern   Not on file  Social History Narrative   Not on file   Social Determinants of Health   Financial Resource Strain: Not on file  Food Insecurity: Not on file  Transportation Needs: Not on file  Physical Activity: Not on file  Stress: Not on file  Social Connections: Not on file  Intimate Partner Violence: Not on file    Outpatient Medications Prior to Visit  Medication Sig Dispense Refill   acetaminophen (TYLENOL) 325 MG tablet Take 2 tablets (650 mg total) by mouth every 6 (six) hours as needed for mild pain (or Fever >/= 101). 20 tablet 0   medroxyPROGESTERone (DEPO-PROVERA) 150 MG/ML injection Inject 150 mg into the muscle every 3 (three) months.     iron polysaccharides (NIFEREX) 150 MG capsule Take 1 capsule (150 mg total) by mouth daily. 30 capsule 0   ondansetron (ZOFRAN) 4 MG tablet Take 1 tablet (4 mg total) by mouth every 6 (six) hours as needed for nausea. 20 tablet 0   No facility-administered medications prior to visit.  No Known Allergies  Review of Systems  Constitutional:  Negative for chills, fever and malaise/fatigue.  Respiratory:  Negative for cough and shortness of breath.   Cardiovascular:  Negative for chest pain, palpitations and leg swelling.  Gastrointestinal:  Negative for abdominal pain, blood in stool, constipation, diarrhea, nausea and vomiting.  Genitourinary:  Positive for flank pain. Negative for dysuria, frequency, hematuria and urgency.  Musculoskeletal:  Negative for myalgias.  Skin: Negative.   Neurological: Negative.   Psychiatric/Behavioral:  Negative for depression. The patient is not nervous/anxious.   All other systems reviewed and are negative.     Objective:     Physical Exam Vitals reviewed.  Constitutional:      General: She is not in acute distress.    Appearance: Normal appearance.  HENT:     Head: Normocephalic.  Cardiovascular:     Rate and Rhythm: Normal rate and regular rhythm.     Pulses: Normal pulses.     Heart sounds: Normal heart sounds.     Comments: No obvious peripheral edema Pulmonary:     Effort: Pulmonary effort is normal.     Breath sounds: Normal breath sounds.  Musculoskeletal:     Cervical back: Normal range of motion and neck supple.  Skin:    General: Skin is warm and dry.     Capillary Refill: Capillary refill takes less than 2 seconds.  Neurological:     General: No focal deficit present.     Mental Status: She is alert and oriented to person, place, and time.  Psychiatric:        Mood and Affect: Mood normal.        Behavior: Behavior normal.        Thought Content: Thought content normal.        Judgment: Judgment normal.    BP 116/70   Pulse 72   Temp 98.2 F (36.8 C)   Ht 5' 2.5" (1.588 m)   Wt 128 lb 0.2 oz (58.1 kg)   SpO2 100%   BMI 23.04 kg/m  Wt Readings from Last 3 Encounters:  09/30/20 128 lb 0.2 oz (58.1 kg)  11/25/19 120 lb (54.4 kg)  11/19/19 120 lb (54.4 kg)     There is no immunization history on file for this patient.  Diabetic Foot Exam - Simple   No data filed     No results found for: TSH Lab Results  Component Value Date   WBC 6.1 12/05/2019   HGB 12.5 12/05/2019   HCT 37.8 12/05/2019   MCV 93 12/05/2019   PLT 385 12/05/2019   Lab Results  Component Value Date   NA 141 12/05/2019   K 4.0 12/05/2019   CO2 24 11/21/2019   GLUCOSE 82 12/05/2019   BUN 9 12/05/2019   CREATININE 0.64 12/05/2019   BILITOT 0.4 12/05/2019   ALKPHOS 99 12/05/2019   AST 36 12/05/2019   ALT 24 11/21/2019   PROT 7.0 12/05/2019   ALBUMIN 4.1 12/05/2019   CALCIUM 9.3 12/05/2019   ANIONGAP 9 11/21/2019   No results found for: CHOL No results found for: HDL No results found for:  LDLCALC No results found for: TRIG No results found for: CHOLHDL Lab Results  Component Value Date   HGBA1C 5.2 11/21/2019       Assessment & Plan:   Problem List Items Addressed This Visit   None Visit Diagnoses     Flank pain    -  Primary   Relevant Medications  nitrofurantoin, macrocrystal-monohydrate, (MACROBID) 100 MG capsule Given patients significant history and similarity in symptoms, will treat for UTI            Reviewed previous urine culture for appropriate antibiotic choice(s)   Other Relevant Orders   POCT URINALYSIS DIP (CLINITEK) (Completed)   Urine Culture   Urine white blood cells increased       Relevant Medications   nitrofurantoin, macrocrystal-monohydrate, (MACROBID) 100 MG capsule   Other Relevant Orders   Urine Culture   Follow up in 1-2 wks or sooner, as needed    I have discontinued Talani L. Matzke's iron polysaccharides and ondansetron. I am also having her start on nitrofurantoin (macrocrystal-monohydrate). Additionally, I am having her maintain her acetaminophen and medroxyPROGESTERone.  Meds ordered this encounter  Medications   nitrofurantoin, macrocrystal-monohydrate, (MACROBID) 100 MG capsule    Sig: Take 1 capsule (100 mg total) by mouth 2 (two) times daily for 7 days.    Dispense:  14 capsule    Refill:  0     Kathrynn Speed, NP

## 2020-09-30 NOTE — Patient Instructions (Signed)
You were seen today in the [facility] for left flank pain. Labs were collected, results will be available via MyChart or, if abnormal, you will be contacted by clinic staff. You were prescribed medications, please take as directed. Please follow up in 1-2 wks as needs for reevaluation.

## 2020-10-01 ENCOUNTER — Emergency Department (HOSPITAL_BASED_OUTPATIENT_CLINIC_OR_DEPARTMENT_OTHER)
Admission: EM | Admit: 2020-10-01 | Discharge: 2020-10-01 | Disposition: A | Discharge status: Home / Self Care | Payer: Medicaid Other | Attending: Emergency Medicine | Admitting: Emergency Medicine

## 2020-10-01 ENCOUNTER — Other Ambulatory Visit: Payer: Self-pay

## 2020-10-01 ENCOUNTER — Emergency Department (HOSPITAL_BASED_OUTPATIENT_CLINIC_OR_DEPARTMENT_OTHER): Payer: Medicaid Other

## 2020-10-01 ENCOUNTER — Encounter (HOSPITAL_BASED_OUTPATIENT_CLINIC_OR_DEPARTMENT_OTHER): Payer: Self-pay | Admitting: *Deleted

## 2020-10-01 DIAGNOSIS — R109 Unspecified abdominal pain: Secondary | ICD-10-CM | POA: Insufficient documentation

## 2020-10-01 DIAGNOSIS — F17210 Nicotine dependence, cigarettes, uncomplicated: Secondary | ICD-10-CM | POA: Insufficient documentation

## 2020-10-01 LAB — CBC WITH DIFFERENTIAL/PLATELET
Abs Immature Granulocytes: 0.02 10*3/uL (ref 0.00–0.07)
Basophils Absolute: 0 10*3/uL (ref 0.0–0.1)
Basophils Relative: 0 %
Eosinophils Absolute: 0.1 10*3/uL (ref 0.0–0.5)
Eosinophils Relative: 1 %
HCT: 38.8 % (ref 36.0–46.0)
Hemoglobin: 13.2 g/dL (ref 12.0–15.0)
Immature Granulocytes: 0 %
Lymphocytes Relative: 28 %
Lymphs Abs: 2.7 10*3/uL (ref 0.7–4.0)
MCH: 30.6 pg (ref 26.0–34.0)
MCHC: 34 g/dL (ref 30.0–36.0)
MCV: 90 fL (ref 80.0–100.0)
Monocytes Absolute: 0.4 10*3/uL (ref 0.1–1.0)
Monocytes Relative: 4 %
Neutro Abs: 6.4 10*3/uL (ref 1.7–7.7)
Neutrophils Relative %: 67 %
Platelets: 335 10*3/uL (ref 150–400)
RBC: 4.31 MIL/uL (ref 3.87–5.11)
RDW: 12.7 % (ref 11.5–15.5)
WBC: 9.6 10*3/uL (ref 4.0–10.5)
nRBC: 0 % (ref 0.0–0.2)

## 2020-10-01 LAB — URINALYSIS, ROUTINE W REFLEX MICROSCOPIC
Bilirubin Urine: NEGATIVE
Glucose, UA: NEGATIVE mg/dL
Hgb urine dipstick: NEGATIVE
Ketones, ur: 5 mg/dL — AB
Leukocytes,Ua: NEGATIVE
Nitrite: NEGATIVE
Protein, ur: NEGATIVE mg/dL
Specific Gravity, Urine: 1.02 (ref 1.005–1.030)
pH: 5.5 (ref 5.0–8.0)

## 2020-10-01 LAB — COMPREHENSIVE METABOLIC PANEL
ALT: 13 U/L (ref 0–44)
AST: 20 U/L (ref 15–41)
Albumin: 4.2 g/dL (ref 3.5–5.0)
Alkaline Phosphatase: 75 U/L (ref 38–126)
Anion gap: 9 (ref 5–15)
BUN: 10 mg/dL (ref 6–20)
CO2: 26 mmol/L (ref 22–32)
Calcium: 9.4 mg/dL (ref 8.9–10.3)
Chloride: 103 mmol/L (ref 98–111)
Creatinine, Ser: 0.8 mg/dL (ref 0.44–1.00)
GFR, Estimated: >60 mL/min (ref >60)
Glucose, Bld: 95 mg/dL (ref 70–99)
Potassium: 3.7 mmol/L (ref 3.5–5.1)
Sodium: 138 mmol/L (ref 135–145)
Total Bilirubin: 0.4 mg/dL (ref 0.3–1.2)
Total Protein: 8 g/dL (ref 6.5–8.1)

## 2020-10-01 LAB — PREGNANCY, URINE: Preg Test, Ur: NEGATIVE

## 2020-10-01 MED ORDER — LIDOCAINE 5 % EX OINT: 1.0000 "application " | TOPICAL_OINTMENT | CUTANEOUS | 0 refills | Status: DC | PRN

## 2020-10-01 MED ORDER — KETOROLAC TROMETHAMINE 60 MG/2ML IM SOLN
30.0000 mg | Freq: Once | INTRAMUSCULAR | Status: AC
  Administered 2020-10-01: 30 mg via INTRAMUSCULAR
  Filled 2020-10-01: qty 2

## 2020-10-01 MED ORDER — MORPHINE SULFATE (PF) 4 MG/ML IV SOLN
4.0000 mg | Freq: Once | INTRAVENOUS | Status: AC
  Administered 2020-10-01: 4 mg via INTRAVENOUS
  Filled 2020-10-01: qty 1

## 2020-10-01 MED ORDER — ONDANSETRON HCL 4 MG/2ML IJ SOLN
4.0000 mg | Freq: Once | INTRAMUSCULAR | Status: AC
  Administered 2020-10-01: 4 mg via INTRAVENOUS
  Filled 2020-10-01: qty 2

## 2020-10-01 NOTE — ED Triage Notes (Signed)
C/o left flank pain x 4 days, seen by PMD x 1 day ago DX uti

## 2020-10-01 NOTE — ED Provider Notes (Signed)
[facility] EMERGENCY DEPARTMENT Provider Note   CSN: [id number] Arrival date & time: 10/01/20  1609     History Chief Complaint  Patient presents with   Flank Pain    Katie Glover is a 35 y.o. female with history of recurrent UTIs presents to the ED for evaluation of worsening left flank pain for the past 4 days.  She reports she was at her PCP office yesterday and was put on Macrobid.  She reports that her left flank pain is worsened, but denies any dysuria, hematuria, abdominal pain, vomiting, constipation, diarrhea, fevers, chest pain, shortness of breath, vaginal discharge, or vaginal bleeding.  Denies a history of renal stones.  She denies any new activity, trauma, MVC's, or falls.  Patient is currently on Depo with the last injection being 9-16.  She denies any medical history.  Surgical history includes cholecystectomy and bilateral tubal ligation.  Denies any daily medications.  No known drug allergies.  Patient is a daily smoker.   Flank Pain Pertinent negatives include no chest pain, no abdominal pain and no shortness of breath.      Past Medical History:  Diagnosis Date   Narcotic abuse in remission [facility])     Patient Active Problem List   Diagnosis Date Noted   Pyelonephritis 11/19/2019   Hypokalemia 11/19/2019   Acute cholecystitis 11/19/2010    Past Surgical History:  Procedure Laterality Date   CHOLECYSTECTOMY  11/19/2010   Procedure: LAPAROSCOPIC CHOLECYSTECTOMY;  Surgeon: Shelly Rubenstein, MD;  Location: [facility] OR;  Service: General;  Laterality: N/A;   TUBAL LIGATION       OB History   No obstetric history on file.     Family History  Problem Relation Age of Onset   Kidney cancer Other    Lung cancer Other    Healthy Mother    Healthy Father     Social History   Tobacco Use   Smoking status: Every Day    Packs/day: 0.50    Years: 9.00    Pack years: 4.50    Types: Cigarettes   Smokeless tobacco: Never  Vaping Use    Vaping Use: Never used  Substance Use Topics   Alcohol use: Yes    Comment: OCCASIONAL   Drug use: Not Currently    Types: Marijuana    Comment: pain medication, pt in recovery    Home Medications Prior to Admission medications   Medication Sig Start Date End Date Taking? Authorizing Provider  lidocaine (XYLOCAINE) 5 % ointment Apply 1 application topically as needed. 10/01/20  Yes Achille Rich, PA-C  acetaminophen (TYLENOL) 325 MG tablet Take 2 tablets (650 mg total) by mouth every 6 (six) hours as needed for mild pain (or Fever >/= 101). 11/21/19   Marguerita Merles Latif, DO  medroxyPROGESTERone (DEPO-PROVERA) 150 MG/ML injection Inject 150 mg into the muscle every 3 (three) months.    [provider]  nitrofurantoin, macrocrystal-monohydrate, (MACROBID) 100 MG capsule Take 1 capsule (100 mg total) by mouth 2 (two) times daily for 7 days. 09/30/20 10/07/20  Orion Crook I, NP    Allergies    Patient has no known allergies.  Review of Systems   Review of Systems  Constitutional:  Negative for chills and fever.  HENT:  Negative for ear pain and sore throat.   Eyes:  Negative for pain and visual disturbance.  Respiratory:  Negative for cough and shortness of breath.   Cardiovascular:  Negative for chest pain and palpitations.  Gastrointestinal:  Negative for abdominal pain, constipation, diarrhea, nausea and vomiting.  Genitourinary:  Positive for flank pain. Negative for decreased urine volume, difficulty urinating, dysuria, frequency, hematuria, urgency, vaginal bleeding, vaginal discharge and vaginal pain.  Musculoskeletal:  Negative for arthralgias and back pain.  Skin:  Negative for color change and rash.  Neurological:  Negative for seizures and syncope.  All other systems reviewed and are negative.  Physical Exam Updated Vital Signs BP 119/83   Pulse 60   Temp 98.2 F (36.8 C) (Oral)   Resp 17   Ht 5\' 2"  (1.575 m)   Wt 58.1 kg   SpO2 100%   BMI 23.41 kg/m    Physical Exam Vitals and nursing note reviewed.  Constitutional:      General: She is not in acute distress.    Appearance: She is not toxic-appearing.     Comments: Appears uncomfortable.  HENT:     Head: Normocephalic and atraumatic.  Eyes:     General: No scleral icterus. Cardiovascular:     Rate and Rhythm: Normal rate and regular rhythm.  Pulmonary:     Effort: Pulmonary effort is normal. No respiratory distress.     Breath sounds: Normal breath sounds.  Abdominal:     General: Abdomen is flat. Bowel sounds are normal.     Palpations: Abdomen is soft.     Tenderness: There is no abdominal tenderness. There is left CVA tenderness. There is no guarding or rebound.  Musculoskeletal:        General: Tenderness present. No swelling or deformity.     Cervical back: Normal range of motion.     Comments: The patient is tender to palpation in the left paraspinal lumbar region.  No overlying skin changes, rashes, ecchymosis, or erythema noted to the area.  No deformities.  No midline tenderness.  Skin:    General: Skin is warm and dry.  Neurological:     General: No focal deficit present.     Mental Status: She is alert. Mental status is at baseline.     Sensory: No sensory deficit.     Motor: No weakness.    ED Results / Procedures / Treatments   Labs (all labs ordered are listed, but only abnormal results are displayed) Labs Reviewed  URINALYSIS, ROUTINE W REFLEX MICROSCOPIC - Abnormal; Notable for the following components:      Result Value   Color, Urine AMBER (*)    APPearance HAZY (*)    Ketones, ur 5 (*)    All other components within normal limits  URINE CULTURE  PREGNANCY, URINE  CBC WITH DIFFERENTIAL/PLATELET  COMPREHENSIVE METABOLIC PANEL    EKG None  Radiology CT Renal Stone Study  Result Date: 10/01/2020 CLINICAL DATA:  Left flank pain. EXAM: CT ABDOMEN AND PELVIS WITHOUT CONTRAST TECHNIQUE: Multidetector CT imaging of the abdomen and pelvis was  performed following the standard protocol without IV contrast. COMPARISON:  CT abdomen and pelvis 03/20/2008. FINDINGS: Lower chest: No acute abnormality. Hepatobiliary: No focal liver abnormality is seen. Status post cholecystectomy. No biliary dilatation. Pancreas: Unremarkable. No pancreatic ductal dilatation or surrounding inflammatory changes. Spleen: Normal in size without focal abnormality. Adrenals/Urinary Tract: There is a 2 cm cyst in the left kidney. The kidneys, adrenal glands and bladder are otherwise within normal limits. Stomach/Bowel: Stomach is within normal limits. Appendix appears normal. No evidence of bowel wall thickening, distention, or inflammatory changes. Vascular/Lymphatic: No significant vascular findings are present. No enlarged abdominal or pelvic lymph nodes.  Reproductive: Uterus and bilateral adnexa are unremarkable. Other: No abdominal wall hernia or abnormality. No abdominopelvic ascites. Musculoskeletal: No acute or significant osseous findings. IMPRESSION: No acute localizing process in the abdomen or pelvis. 2 cm left renal cyst. Electronically Signed   By: Darliss Cheney M.D.   On: 10/01/2020 18:53    Procedures Procedures   Medications Ordered in ED Medications  morphine 4 MG/ML injection 4 mg (4 mg Intravenous Given 10/01/20 1833)  ondansetron (ZOFRAN) injection 4 mg (4 mg Intravenous Given 10/01/20 1832)  ketorolac (TORADOL) injection 30 mg (30 mg Intramuscular Given 10/01/20 1936)    ED Course  I have reviewed the triage vital signs and the nursing notes.  Pertinent labs & imaging results that were available during my care of the patient were reviewed by me and considered in my medical decision making (see chart for details).  MENDI CONSTABLE is a 35 year old female presenting with worsening left flank pain.  Differential diagnosis includes pyelonephritis, hydro nephritis, left flank pain, MSK, obstructed ureter.  I personally reviewed and interpreted the  patient's labs and imaging.  CBC and CMP negative.  Urinalysis shows hazy amber urine with 5 ketones, but no nitrates, leukocytes, or blood.  Patient had a urinalysis yesterday by her PCP that was otherwise normal other than trace leukocytes.  Urine culture pending.  CT renal stone study shows a 2 cm left renal cyst, but no acute localizing process in the abdomen or pelvis.  Patient was given 4 mg of morphine, but still reports unchanged pain.  Low suspicion for pyelonephritis, hydro nephritis, or obstructed ureter.  On physical exam, the patient is experiencing left flank and left lumbar lumbar paraspinal tenderness.  Will order Toradol for pain relief.  She reports mild relief with Toradol, patient is sitting comfortably in bed.  Based on patient's labs, imaging, and vitals, low suspicion for infection.  Labs and imaging discussed with patient.  Recommended continuation of Macrobid prescribed by PCP.  Recommended heat and/or ice applied to the area along with Tylenol or ibuprofen for pain.  Prescribed topical lidocaine. Strict return precautions given.  Patient agrees with plan.  Patient is stable and being discharged home condition.  MDM Rules/Calculators/A&P                          Final Clinical Impression(s) / ED Diagnoses Final diagnoses:  Left flank pain    Rx / DC Orders ED Discharge Orders          Ordered    lidocaine (XYLOCAINE) 5 % ointment  As needed        10/01/20 2027             Achille Rich, PA-C 10/01/20 2034    Rolan Bucco, MD 10/01/20 2242

## 2020-10-01 NOTE — Discharge Instructions (Addendum)
You are seen here today for left flank pain.  You been prescribed topical lidocaine to help with pain in addition to Tylenol and/or ibuprofen.  You may also use topical lidocaine patches over-the-counter.  This pain is likely musculoskeletal.  Follow-up with your PCP.  A urine culture has been ordered, you will receive a phone call if it is positive. If you have any new or worsening symptoms, such as fever, worsening flank pain, blood in urine, or nausea/vomiting, please return to the nearest ED.

## 2020-10-02 LAB — URINE CULTURE: Culture: NO GROWTH

## 2020-10-03 ENCOUNTER — Other Ambulatory Visit: Payer: Self-pay

## 2020-10-03 ENCOUNTER — Encounter (HOSPITAL_BASED_OUTPATIENT_CLINIC_OR_DEPARTMENT_OTHER): Payer: Self-pay | Admitting: Emergency Medicine

## 2020-10-03 ENCOUNTER — Emergency Department (HOSPITAL_BASED_OUTPATIENT_CLINIC_OR_DEPARTMENT_OTHER)
Admission: EM | Admit: 2020-10-03 | Discharge: 2020-10-03 | Disposition: A | Discharge status: Home / Self Care | Payer: Medicaid Other | Attending: Emergency Medicine | Admitting: Emergency Medicine

## 2020-10-03 DIAGNOSIS — B029 Zoster without complications: Secondary | ICD-10-CM | POA: Insufficient documentation

## 2020-10-03 DIAGNOSIS — R21 Rash and other nonspecific skin eruption: Secondary | ICD-10-CM | POA: Diagnosis present

## 2020-10-03 DIAGNOSIS — F17210 Nicotine dependence, cigarettes, uncomplicated: Secondary | ICD-10-CM | POA: Insufficient documentation

## 2020-10-03 DIAGNOSIS — R109 Unspecified abdominal pain: Secondary | ICD-10-CM | POA: Insufficient documentation

## 2020-10-03 MED ORDER — VALACYCLOVIR HCL 1 G PO TABS: 1000.0000 mg | ORAL_TABLET | Freq: Three times a day (TID) | ORAL | 0 refills | Status: AC

## 2020-10-03 NOTE — ED Provider Notes (Signed)
[facility] EMERGENCY DEPARTMENT Provider Note   CSN: [id number] Arrival date & time: 10/03/20  1610     History Chief Complaint  Patient presents with   Rash    Katie Glover is a 35 y.o. female with a past medical history of childhood chickenpox presenting today with a complaint of a painful rash.  Patient reports that this rash began on Thursday of last week.  On Wednesday patient was experiencing left-sided flank pain.  She visited her primary care office who sent her to the emergency department to be evaluated for a kidney stone or kidney infection.  She was evaluated in our department without significant finding at this time.  Patient reports that she did have a rash at this time however it has spread over the past few days.  Rash is painful.  Has been treating with Tylenol and ibuprofen which has helped.  Denies involvement of other areas of her skin.  No fevers, chills, numbness or tingling.  No one else with this rash.  Patient is not immunocompromised.    Past Medical History:  Diagnosis Date   Narcotic abuse in remission [facility])     Patient Active Problem List   Diagnosis Date Noted   Pyelonephritis 11/19/2019   Hypokalemia 11/19/2019   Acute cholecystitis 11/19/2010    Past Surgical History:  Procedure Laterality Date   CHOLECYSTECTOMY  11/19/2010   Procedure: LAPAROSCOPIC CHOLECYSTECTOMY;  Surgeon: Shelly Rubenstein, MD;  Location: [facility] OR;  Service: General;  Laterality: N/A;   TUBAL LIGATION       OB History   No obstetric history on file.     Family History  Problem Relation Age of Onset   Kidney cancer Other    Lung cancer Other    Healthy Mother    Healthy Father     Social History   Tobacco Use   Smoking status: Every Day    Packs/day: 0.50    Years: 9.00    Pack years: 4.50    Types: Cigarettes   Smokeless tobacco: Never  Vaping Use   Vaping Use: Never used  Substance Use Topics   Alcohol use: Yes    Comment: OCCASIONAL    Drug use: Not Currently    Types: Marijuana    Comment: pain medication, pt in recovery    Home Medications Prior to Admission medications   Medication Sig Start Date End Date Taking? Authorizing Provider  valACYclovir (VALTREX) 1000 MG tablet Take 1 tablet (1,000 mg total) by mouth 3 (three) times daily for 7 days. 10/03/20 10/10/20 Yes Meghna Hagmann A, PA-C  acetaminophen (TYLENOL) 325 MG tablet Take 2 tablets (650 mg total) by mouth every 6 (six) hours as needed for mild pain (or Fever >/= 101). 11/21/19   Sheikh, Kateri Mc Latif, DO  lidocaine (XYLOCAINE) 5 % ointment Apply 1 application topically as needed. 10/01/20   Achille Rich, PA-C  medroxyPROGESTERone (DEPO-PROVERA) 150 MG/ML injection Inject 150 mg into the muscle every 3 (three) months.    [provider]  nitrofurantoin, macrocrystal-monohydrate, (MACROBID) 100 MG capsule Take 1 capsule (100 mg total) by mouth 2 (two) times daily for 7 days. 09/30/20 10/07/20  Orion Crook I, NP    Allergies    Patient has no known allergies.  Review of Systems   Review of Systems  Constitutional:  Negative for chills and fever.  HENT:  Negative for ear pain and hearing loss.   Skin:  Positive for rash.  Neurological:  Negative for  numbness.  All other systems reviewed and are negative.  Physical Exam Updated Vital Signs BP 116/79   Pulse 92   Temp 98.5 F (36.9 C) (Oral)   Resp 18   Ht 5\' 2"  (1.575 m)   Wt 57.6 kg   SpO2 96%   BMI 23.23 kg/m   Physical Exam Vitals and nursing note reviewed.  Constitutional:      Appearance: Normal appearance.  HENT:     Head: Normocephalic and atraumatic.     Right Ear: External ear normal.     Left Ear: External ear normal.  Eyes:     General: No scleral icterus.    Conjunctiva/sclera: Conjunctivae normal.  Pulmonary:     Effort: Pulmonary effort is normal. No respiratory distress.  Musculoskeletal:        General: Normal range of motion.  Skin:    General: Skin is warm  and dry.     Findings: Rash (Erythematous papular rash along T4/T5 dermatome.  Does not cross midline, only left-sided involvement.  No crusting or weeping.  No vesicles noted.) present.     Comments: No rashes noted anywhere else on patient's body.  No ear involvement.  Neurological:     Mental Status: She is alert.  Psychiatric:        Mood and Affect: Mood normal.        Behavior: Behavior normal.    ED Results / Procedures / Treatments   Labs (all labs ordered are listed, but only abnormal results are displayed) Labs Reviewed - No data to display  EKG None  Radiology CT Renal Stone Study  Result Date: 10/01/2020 CLINICAL DATA:  Left flank pain. EXAM: CT ABDOMEN AND PELVIS WITHOUT CONTRAST TECHNIQUE: Multidetector CT imaging of the abdomen and pelvis was performed following the standard protocol without IV contrast. COMPARISON:  CT abdomen and pelvis 03/20/2008. FINDINGS: Lower chest: No acute abnormality. Hepatobiliary: No focal liver abnormality is seen. Status post cholecystectomy. No biliary dilatation. Pancreas: Unremarkable. No pancreatic ductal dilatation or surrounding inflammatory changes. Spleen: Normal in size without focal abnormality. Adrenals/Urinary Tract: There is a 2 cm cyst in the left kidney. The kidneys, adrenal glands and bladder are otherwise within normal limits. Stomach/Bowel: Stomach is within normal limits. Appendix appears normal. No evidence of bowel wall thickening, distention, or inflammatory changes. Vascular/Lymphatic: No significant vascular findings are present. No enlarged abdominal or pelvic lymph nodes. Reproductive: Uterus and bilateral adnexa are unremarkable. Other: No abdominal wall hernia or abnormality. No abdominopelvic ascites. Musculoskeletal: No acute or significant osseous findings. IMPRESSION: No acute localizing process in the abdomen or pelvis. 2 cm left renal cyst. Electronically Signed   By: 03/22/2008 M.D.   On: 10/01/2020 18:53     Procedures Procedures   Medications Ordered in ED Medications - No data to display  ED Course  I have reviewed the triage vital signs and the nursing notes.  Pertinent labs & imaging results that were available during my care of the patient were reviewed by me and considered in my medical decision making (see chart for details).    MDM Rules/Calculators/A&P  Patient was evaluated by me.  Rash consistent with varicella zoster.  Patient did not have any involvement of other areas of the skin.  No concern for Ramsay Hunt syndrome or zoster ophthalmicus.  Patient has been successfully alleviating her pain with over-the-counter remedies.  Also with a history of narcotic abuse, in remission.  She may continue to utilize over-the-counter pain regimen.  I  have sent valacyclovir to her pharmacy for treatment of her shingles.  She may follow-up with her primary care provider if symptoms persist.  Information about shingles was attached to her discharge papers.  She had no further questions and was thankful for her care.  Final Clinical Impression(s) / ED Diagnoses Final diagnoses:  Herpes zoster without complication    Rx / DC Orders ED Discharge Orders          Ordered    valACYclovir (VALTREX) 1000 MG tablet  3 times daily        10/03/20 1732             Gerik Coberly A, PA-C 10/03/20 1754    Rolan Bucco, MD 10/03/20 [phone]

## 2020-10-03 NOTE — ED Triage Notes (Signed)
Pt seen here for LT side pain 2 days ago; sts everything was normal; rash to left side trunk appeared later that night; appears consistent with shingles; pt sts pain is worse

## 2020-10-03 NOTE — Discharge Instructions (Addendum)
Your presentation and rash are consistent with shingles.  I have attached information about this diagnosis to your discharge papers.  I have also sent an antiviral medication, valacyclovir, to the pharmacy.  Please take this as directed.  You should follow-up with your primary care provider if symptoms persist.  It was a pleasure to meet you and I hope that you feel better.

## 2020-10-04 LAB — URINE CULTURE

## 2020-10-06 ENCOUNTER — Other Ambulatory Visit (INDEPENDENT_AMBULATORY_CARE_PROVIDER_SITE_OTHER): Payer: Medicaid Other | Admitting: Nurse Practitioner

## 2020-10-06 ENCOUNTER — Encounter: Payer: Medicaid Other | Admitting: Nurse Practitioner

## 2020-10-06 DIAGNOSIS — B028 Zoster with other complications: Secondary | ICD-10-CM

## 2020-10-06 MED ORDER — GABAPENTIN 300 MG PO CAPS: 300.0000 mg | ORAL_CAPSULE | Freq: Three times a day (TID) | ORAL | 0 refills | Status: DC

## 2020-10-06 NOTE — Progress Notes (Signed)
Virtual Visit via Telephone Note  I connected with Katie Glover on 10/06/20 at 1420 by telephone and verified that I am speaking with the correct person using two identifiers.   I discussed the limitations, risks, security and privacy concerns of performing an evaluation and management service by telephone and the availability of in person appointments. I also discussed with the patient that there may be a patient responsible charge related to this service. The patient expressed understanding and agreed to proceed.  Patient home Provider Office  History of Present Illness:  Katie Glover  has a past medical history of Narcotic abuse in remission (HCC). Patient states that she painful rash for the past several days. Was evaluated in the ED on 10/1 and diagnosed and treated for shingles. Has been taking the prescribed valtrex and taking OTC medications for pain with no improvement. Was informed by ED provider to contact PCP for prescription for gabapentin if pain remained unresolved after initiation of treatment. Patient currently rates pain 10/10 and requesting prescription for gabapentin. Has been compliant with prescribed valtrex.    Review of Systems  Constitutional:  Negative for chills, fever and malaise/fatigue.  Respiratory:  Negative for cough and shortness of breath.   Cardiovascular:  Negative for chest pain, palpitations and leg swelling.  Gastrointestinal:  Negative for abdominal pain, blood in stool, constipation, diarrhea, nausea and vomiting.  Skin:  Positive for rash.  Neurological: Negative.   Psychiatric/Behavioral:  Negative for depression. The patient is not nervous/anxious.   All other systems reviewed and are negative.   Observations/Objective: No exam; telephone visit  Assessment and Plan: 1. Herpes zoster with complication - gabapentin (NEURONTIN) 300 MG capsule; Take 1 capsule (300 mg total) by mouth 3 (three) times daily for 21 days.  Dispense: 63  capsule; Refill: 0  Gabapentin prescribed for neuropathic pain related to shingles Informed to continue taking OTC medications as needed with prescribed gabapentin and valtrex  Follow Up Instructions: Follow up in 1-2 wks for reevaluation of symptoms, sooner as needed    I discussed the assessment and treatment plan with the patient. The patient was provided an opportunity to ask questions and all were answered. The patient agreed with the plan and demonstrated an understanding of the instructions.   The patient was advised to call back or seek an in-person evaluation if the symptoms worsen or if the condition fails to improve as anticipated.  I provided 10 minutes of telephone- visit time during this encounter.   Kathrynn Speed, NP

## 2020-10-21 ENCOUNTER — Ambulatory Visit: Payer: Medicaid Other | Admitting: Nurse Practitioner

## 2020-12-11 ENCOUNTER — Other Ambulatory Visit: Payer: Self-pay

## 2020-12-11 ENCOUNTER — Ambulatory Visit (INDEPENDENT_AMBULATORY_CARE_PROVIDER_SITE_OTHER): Payer: Medicaid Other | Admitting: Nurse Practitioner

## 2020-12-11 DIAGNOSIS — Z862 Personal history of diseases of the blood and blood-forming organs and certain disorders involving the immune mechanism: Secondary | ICD-10-CM

## 2020-12-11 DIAGNOSIS — R82998 Other abnormal findings in urine: Secondary | ICD-10-CM

## 2020-12-11 DIAGNOSIS — Z3042 Encounter for surveillance of injectable contraceptive: Secondary | ICD-10-CM | POA: Diagnosis not present

## 2020-12-11 LAB — POCT URINALYSIS DIP (CLINITEK)
Bilirubin, UA: NEGATIVE
Blood, UA: NEGATIVE
Glucose, UA: NEGATIVE mg/dL
Ketones, POC UA: NEGATIVE mg/dL
Nitrite, UA: NEGATIVE
Spec Grav, UA: 1.02 (ref 1.010–1.025)
Urobilinogen, UA: 1 E.U./dL
pH, UA: 7 (ref 5.0–8.0)

## 2020-12-11 LAB — POCT URINE PREGNANCY: Preg Test, Ur: NEGATIVE

## 2020-12-11 MED ORDER — MEDROXYPROGESTERONE ACETATE 150 MG/ML IM SUSP
150.0000 mg | Freq: Once | INTRAMUSCULAR | Status: AC
  Administered 2020-12-11: 150 mg via INTRAMUSCULAR

## 2020-12-11 MED ORDER — NITROFURANTOIN MONOHYD MACRO 100 MG PO CAPS: 100.0000 mg | ORAL_CAPSULE | Freq: Two times a day (BID) | ORAL | 0 refills | Status: AC

## 2020-12-11 NOTE — Progress Notes (Signed)
Patient in for depo injection. Patient to return for next dose between Feb 24 - Mar 10. Patient also stated she is prone to UTI and currently feeling like one is coming. She complains of odor in urine and has trace of leu in urine. Per [facility] urine culture ordered and will be sending is rx for Macrobid. Patient aware.

## 2020-12-15 LAB — URINE CULTURE

## 2020-12-21 NOTE — Progress Notes (Signed)
This encounter was created in error - please disregard.

## 2021-02-26 ENCOUNTER — Ambulatory Visit: Payer: Medicaid Other | Admitting: Nurse Practitioner

## 2021-03-05 ENCOUNTER — Other Ambulatory Visit: Payer: Self-pay

## 2021-03-05 ENCOUNTER — Encounter: Payer: Self-pay | Admitting: Nurse Practitioner

## 2021-03-05 ENCOUNTER — Ambulatory Visit (INDEPENDENT_AMBULATORY_CARE_PROVIDER_SITE_OTHER): Payer: Medicaid Other | Admitting: Nurse Practitioner

## 2021-03-05 VITALS — BP 121/80 | HR 81 | Temp 98.7°F | Ht 62.5 in | Wt 128.0 lb

## 2021-03-05 DIAGNOSIS — Z01419 Encounter for gynecological examination (general) (routine) without abnormal findings: Secondary | ICD-10-CM | POA: Diagnosis not present

## 2021-03-05 DIAGNOSIS — Z3042 Encounter for surveillance of injectable contraceptive: Secondary | ICD-10-CM

## 2021-03-05 LAB — POCT URINE PREGNANCY: Preg Test, Ur: NEGATIVE

## 2021-03-05 MED ORDER — MEDROXYPROGESTERONE ACETATE 150 MG/ML IM SUSP
150.0000 mg | Freq: Once | INTRAMUSCULAR | Status: AC
  Administered 2021-03-05: 150 mg via INTRAMUSCULAR

## 2021-03-05 NOTE — Progress Notes (Signed)
? ?  [facility] ?[address] ?[place], [place]  [address] ?Phone:  [phone]   Fax:  [phone] ? ? ?Subjective:  ?  ? Katie Glover is a 36 y.o. female and is here for a comprehensive physical exam. The patient reports no problems. ? ?Social History  ? ?Socioeconomic History  ? Marital status: Single  ?  Spouse name: Not on file  ? Number of children: Not on file  ? Years of education: Not on file  ? Highest education level: Not on file  ?Occupational History  ? Not on file  ?Tobacco Use  ? Smoking status: Every Day  ?  Packs/day: 0.50  ?  Years: 9.00  ?  Pack years: 4.50  ?  Types: Cigarettes  ? Smokeless tobacco: Never  ?Vaping Use  ? Vaping Use: Never used  ?Substance and Sexual Activity  ? Alcohol use: Yes  ?  Comment: OCCASIONAL  ? Drug use: Yes  ?  Types: Marijuana  ?  Comment: occ  ? Sexual activity: Yes  ?  Birth control/protection: Injection, Surgical  ?Other Topics Concern  ? Not on file  ?Social History Narrative  ? Not on file  ? ?Social Determinants of Health  ? ?Financial Resource Strain: Not on file  ?Food Insecurity: Not on file  ?Transportation Needs: Not on file  ?Physical Activity: Not on file  ?Stress: Not on file  ?Social Connections: Not on file  ?Intimate Partner Violence: Not on file  ? ?Health Maintenance  ?Topic Date Due  ? COVID-19 Vaccine (1) Never done  ? Hepatitis C Screening  Never done  ? TETANUS/TDAP  Never done  ? PAP SMEAR-Modifier  Never done  ? INFLUENZA VACCINE  04/02/2021 (Originally 08/03/2020)  ? HIV Screening  Completed  ? HPV VACCINES  Aged Out  ? ? ?The following portions of the patient's history were reviewed and updated as appropriate: allergies, current medications, past family history, past medical history, past social history, past surgical history, and problem list. ? ?Review of Systems ?Pertinent items are noted in HPI.  ? ?Objective:  ? ? General appearance: alert, cooperative, and appears stated age ?Pelvic: cervix normal in  appearance, external genitalia normal, no adnexal masses or tenderness, no cervical motion tenderness, rectovaginal septum normal, and uterus normal size, shape, and consistency  ?  ?Assessment:  ? ? Healthy female exam.  ?  ?Plan:  ? ?  ?See After Visit Summary for Counseling Recommendations  ? ?

## 2021-03-09 LAB — IGP, CTNG, RFX APTIMA HPV ASCU
Chlamydia, Nuc. Acid Amp: NEGATIVE
Gonococcus by Nucleic Acid Amp: NEGATIVE

## 2021-05-12 ENCOUNTER — Encounter (HOSPITAL_COMMUNITY): Payer: Self-pay

## 2021-05-12 ENCOUNTER — Ambulatory Visit (HOSPITAL_COMMUNITY)
Admission: EM | Admit: 2021-05-12 | Discharge: 2021-05-12 | Disposition: A | Discharge status: Home / Self Care | Payer: Medicaid Other | Attending: Nurse Practitioner | Admitting: Nurse Practitioner

## 2021-05-12 DIAGNOSIS — Q181 Preauricular sinus and cyst: Secondary | ICD-10-CM

## 2021-05-12 DIAGNOSIS — L0201 Cutaneous abscess of face: Secondary | ICD-10-CM

## 2021-05-12 MED ORDER — DOXYCYCLINE HYCLATE 100 MG PO CAPS: 100.0000 mg | ORAL_CAPSULE | Freq: Two times a day (BID) | ORAL | 0 refills | Status: DC

## 2021-05-12 NOTE — ED Provider Notes (Signed)
?[facility] ? ? ? ?CSN: [id number] ?Arrival date & time: 05/12/21  1501 ? ? ?  ? ?History   ?Chief Complaint ?No chief complaint on file. ? ? ?HPI ?Katie Glover is a 36 y.o. female.  ? ?Patient presents with complain of bump in front of her ear.  She reports about 1 week ago, she noticed a small pimple in front of her left ear.  She reports slowly, the bumps are getting bigger.  Yesterday, her mom gave her some bacitracin ointment and she applied this to the bump.  She reports when she peeled off the Band-Aid this morning, a scab came with it and that is when she noticed drainage.  She reports today, the bump has been very painful and draining a white fluid.  She denies fevers, nausea/vomiting, body aches or chills.  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 ? ? ?Past Medical History:  ?Diagnosis Date  ? Narcotic abuse in remission [facility])   ? ? ?Patient Active Problem List  ? Diagnosis Date Noted  ? Pyelonephritis 11/19/2019  ? Hypokalemia 11/19/2019  ? Acute cholecystitis 11/19/2010  ? ? ?Past Surgical History:  ?Procedure Laterality Date  ? CHOLECYSTECTOMY  11/19/2010  ? Procedure: LAPAROSCOPIC CHOLECYSTECTOMY;  Surgeon: Shelly Rubenstein, MD;  Location: [facility] OR;  Service: General;  Laterality: N/A;  ? TUBAL LIGATION    ? ? ?OB History   ?No obstetric history on file. ?  ? ? ? ?Home Medications   ? ?Prior to Admission medications   ?Medication Sig Start Date End Date Taking? Authorizing Provider  ?doxycycline (VIBRAMYCIN) 100 MG capsule Take 1 capsule (100 mg total) by mouth 2 (two) times daily. 05/12/21  Yes Valentino Nose, NP  ?medroxyPROGESTERone (DEPO-PROVERA) 150 MG/ML injection Inject 150 mg into the muscle every 3 (three) months.    [provider]  ? ? ?Family History ?Family History  ?Problem Relation Age of Onset  ? Kidney cancer Other   ? Lung cancer Other   ? Healthy Mother   ? Healthy Father   ? ? ?Social History ?Social History   ? ?Tobacco Use  ? Smoking status: Every Day  ?  Packs/day: 0.50  ?  Years: 9.00  ?  Pack years: 4.50  ?  Types: Cigarettes  ? Smokeless tobacco: Never  ?Vaping Use  ? Vaping Use: Never used  ?Substance Use Topics  ? Alcohol use: Yes  ?  Comment: OCCASIONAL  ? Drug use: Yes  ?  Types: Marijuana  ?  Comment: occ  ? ? ? ?Allergies   ?Patient has no known allergies. ? ? ?Review of Systems ?Review of Systems ?Per HPI ? ?Physical Exam ?Triage Vital Signs ?ED Triage Vitals [05/12/21 1530]  ?Enc Vitals Group  ?   BP (!) 148/95  ?   Pulse Rate 78  ?   Resp 16  ?   Temp 98.4 ?F (36.9 ?C)  ?   Temp Source Oral  ?   SpO2 98 %  ?   Weight   ?   Height   ?   Head Circumference   ?   Peak Flow   ?   Pain Score 5  ?   Pain Loc   ?   Pain Edu?   ?   Excl. in GC?   ? ?No data found. ? ?Updated Vital Signs ?BP (!) 148/95 (BP Location: Left Arm)   Pulse 78  Temp 98.4 ?F (36.9 ?C) (Oral)   Resp 16   SpO2 98%  ? ?Visual Acuity ?Right Eye Distance:   ?Left Eye Distance:   ?Bilateral Distance:   ? ?Right Eye Near:   ?Left Eye Near:    ?Bilateral Near:    ? ?Physical Exam ?Vitals and nursing note reviewed.  ?Constitutional:   ?   General: She is not in acute distress. ?   Appearance: Normal appearance. She is not toxic-appearing.  ?Eyes:  ?   General: No scleral icterus. ?   Extraocular Movements: Extraocular movements intact.  ?Pulmonary:  ?   Effort: Pulmonary effort is normal. No respiratory distress.  ?Skin: ?   General: Skin is warm and dry.  ?   Capillary Refill: Capillary refill takes less than 2 seconds.  ?   Coloration: Skin is not jaundiced or pale.  ?   Findings: Abscess present. No erythema.  ?   Comments: Approximately 1 cm x 1.5 cm indurated abscess to preauricular area on the left side; there is white purulent material draining .    ?Neurological:  ?   Mental Status: She is alert and oriented to person, place, and time.  ?Psychiatric:     ?   Behavior: Behavior is cooperative.  ? ? ? ?UC Treatments / Results   ?Labs ?(all labs ordered are listed, but only abnormal results are displayed) ?Labs Reviewed - No data to display ? ?EKG ? ? ?Radiology ?No results found. ? ?Procedures ?Procedures (including critical care time) ? ?Medications Ordered in UC ?Medications - No data to display ? ?Initial Impression / Assessment and Plan / UC Course  ?I have reviewed the triage vital signs and the nursing notes. ? ?Pertinent labs & imaging results that were available during my care of the patient were reviewed by me and considered in my medical decision making (see chart for details). ? ?  ?We discussed options today including incision and drainage versus conservative therapy with oral antibiotics.  Patient elects to proceed with conservative therapy.  Start doxycycline 100 mg twice daily for 7 days for infection and abscess.  Encouraged warm compresses, massage.  Patient to return to clinic if symptoms persist or worsen or if drainage/swelling increases.  She may also follow-up with ear nose and throat-contact information given.  If she develops fevers, nausea vomiting, fatigue, she is go emergency room.  The patient was given the opportunity to ask questions.  All questions answered to their satisfaction.  The patient is in agreement to this plan.  ? ?Final Clinical Impressions(s) / UC Diagnoses  ? ?Final diagnoses:  ?Abscess of preauricular sinus  ? ? ? ?Discharge Instructions   ? ?  ?- Please start the doxycycline (antibiotic) and take the entire course to help clear up the infection in the area in front of your ear ?-You can also use warm compresses and massage to help the rest of the fluid drain ?-Please follow-up either in urgent care or with the ear nose and throat provider if the fluid recollects or if the area is not improving ? ? ? ?ED Prescriptions   ? ? Medication Sig Dispense Auth. Provider  ? doxycycline (VIBRAMYCIN) 100 MG capsule Take 1 capsule (100 mg total) by mouth 2 (two) times daily. 14 capsule Valentino Nose, NP  ? ?  ? ?PDMP not reviewed this encounter. ?  ?Valentino Nose, NP ?05/12/21 2058 ? ?

## 2021-05-12 NOTE — Discharge Instructions (Signed)
-   Please start the doxycycline (antibiotic) and take the entire course to help clear up the infection in the area in front of your ear ?-You can also use warm compresses and massage to help the rest of the fluid drain ?-Please follow-up either in urgent care or with the ear nose and throat provider if the fluid recollects or if the area is not improving ?

## 2021-05-12 NOTE — ED Triage Notes (Signed)
Pt reports bump on her left ear. She has noticed some drainage.  ?

## 2021-06-07 ENCOUNTER — Ambulatory Visit (INDEPENDENT_AMBULATORY_CARE_PROVIDER_SITE_OTHER): Payer: Medicaid Other | Admitting: Nurse Practitioner

## 2021-06-07 ENCOUNTER — Encounter: Payer: Self-pay | Admitting: Nurse Practitioner

## 2021-06-07 VITALS — BP 113/76 | HR 71 | Temp 97.9°F | Ht 62.5 in | Wt 131.2 lb

## 2021-06-07 DIAGNOSIS — Z789 Other specified health status: Secondary | ICD-10-CM

## 2021-06-07 DIAGNOSIS — R0602 Shortness of breath: Secondary | ICD-10-CM

## 2021-06-07 DIAGNOSIS — R0789 Other chest pain: Secondary | ICD-10-CM | POA: Diagnosis not present

## 2021-06-07 LAB — POCT URINE PREGNANCY: Preg Test, Ur: NEGATIVE

## 2021-06-07 MED ORDER — MEDROXYPROGESTERONE ACETATE 150 MG/ML IM SUSP
150.0000 mg | Freq: Once | INTRAMUSCULAR | Status: AC
  Administered 2021-06-07: 150 mg via INTRAMUSCULAR

## 2021-06-07 NOTE — Patient Instructions (Signed)
You were seen today in the [facility] for depo injection. Labs were collected, results will be available via MyChart or, if abnormal, you will be contacted by clinic staff. Please follow up in 3 mths for depo injection.

## 2021-06-07 NOTE — Progress Notes (Signed)
[facility] Patient [facility] [address] [place], [place]  [address] Phone:  [phone]   Fax:  [phone] Subjective:   Patient ID: Katie Glover, female    DOB: August 19, 1985, 36 y.o.   MRN: [id number]  Chief Complaint  Patient presents with   Follow-up    Pt is here for 3 month's follow up. Pt is here for depo shot. Pt stated she has tightness under her right arm    HPI Katie Glover 36 y.o. 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 To the [facility] for depo injection and evaluation of tightness below right shoulder.  States that she injured her shoulder 3 wsk ago reaching for laundry detergent. Since injury, has been experiencing tightness to the right anterior shoulder/ axilla. Has not taken any medications for symptoms. Denies any pain. States that symptoms are more pronounced at night. Also has had some shortness of breath with tightness. Denies any other concerns today. Symptoms occur intermittently throughout the day. Concerned because she is a chronic smoker.   Denies any fatigue, chest pain, shortness of breath, HA or dizziness. Denies any blurred vision, numbness or tingling.   Past Medical History:  Diagnosis Date   Narcotic abuse in remission [facility])     Past Surgical History:  Procedure Laterality Date   CHOLECYSTECTOMY  11/19/2010   Procedure: LAPAROSCOPIC CHOLECYSTECTOMY;  Surgeon: Shelly Rubenstein, MD;  Location: [facility] OR;  Service: General;  Laterality: N/A;   TUBAL LIGATION      Family History  Problem Relation Age of Onset   Kidney cancer Other    Lung cancer Other    Healthy Mother    Healthy Father     Social History   Socioeconomic History   Marital status: Single    Spouse name: Not on file   Number of children: Not on file   Years of education: Not on file   Highest education level: Not on file  Occupational History   Not on file  Tobacco Use   Smoking status: Every Day    Packs/day: 0.50     Years: 9.00    Total pack years: 4.50    Types: Cigarettes   Smokeless tobacco: Never  Vaping Use   Vaping Use: Never used  Substance and Sexual Activity   Alcohol use: Yes    Comment: OCCASIONAL   Drug use: Yes    Types: Marijuana    Comment: occ   Sexual activity: Yes    Birth control/protection: Injection, Surgical  Other Topics Concern   Not on file  Social History Narrative   Not on file   Social Determinants of Health   Financial Resource Strain: Not on file  Food Insecurity: Not on file  Transportation Needs: Not on file  Physical Activity: Not on file  Stress: Not on file  Social Connections: Not on file  Intimate Partner Violence: Not on file    Outpatient Medications Prior to Visit  Medication Sig Dispense Refill   medroxyPROGESTERone (DEPO-PROVERA) 150 MG/ML injection Inject 150 mg into the muscle every 3 (three) months.     doxycycline (VIBRAMYCIN) 100 MG capsule Take 1 capsule (100 mg total) by mouth 2 (two) times daily. (Patient not taking: Reported on 06/07/2021) 14 capsule 0   No facility-administered medications prior to visit.    No Known Allergies  Review of Systems  Constitutional:  Negative for chills, fever and malaise/fatigue.  Respiratory:  Negative for cough and  shortness of breath.   Cardiovascular:  Positive for chest pain. Negative for palpitations and leg swelling.       See HPI  Gastrointestinal:  Negative for abdominal pain, blood in stool, constipation, diarrhea, nausea and vomiting.  Musculoskeletal:  Positive for joint pain.       See HPI  Skin: Negative.   Neurological: Negative.   Psychiatric/Behavioral:  Negative for depression. The patient is not nervous/anxious.   All other systems reviewed and are negative.      Objective:    Physical Exam Vitals reviewed.  Constitutional:      General: She is not in acute distress.    Appearance: Normal appearance. She is normal weight.  HENT:     Head: Normocephalic.  Eyes:      General: No scleral icterus.       Right eye: No discharge.        Left eye: No discharge.     Extraocular Movements: Extraocular movements intact.     Conjunctiva/sclera: Conjunctivae normal.     Pupils: Pupils are equal, round, and reactive to light.  Neck:     Vascular: No carotid bruit.  Cardiovascular:     Rate and Rhythm: Normal rate and regular rhythm.     Pulses: Normal pulses.     Heart sounds: Normal heart sounds.     Comments: No obvious peripheral edema. Diffuse anterior chest non tender to palpation Pulmonary:     Effort: Pulmonary effort is normal.     Breath sounds: Normal breath sounds.  Musculoskeletal:        General: No swelling, tenderness, deformity or signs of injury. Normal range of motion.     Cervical back: Normal range of motion and neck supple. No rigidity or tenderness.     Right lower leg: No edema.     Left lower leg: No edema.  Lymphadenopathy:     Cervical: No cervical adenopathy.  Skin:    General: Skin is warm and dry.     Capillary Refill: Capillary refill takes less than 2 seconds.  Neurological:     Mental Status: She is alert.  Psychiatric:        Mood and Affect: Mood normal.        Behavior: Behavior normal.        Thought Content: Thought content normal.        Judgment: Judgment normal.     BP 113/76 (BP Location: Left Arm, Patient Position: Sitting, Cuff Size: Normal)   Pulse 71   Temp 97.9 F (36.6 C)   Ht 5' 2.5" (1.588 m)   Wt 131 lb 4 oz (59.5 kg)   SpO2 100%   BMI 23.62 kg/m  Wt Readings from Last 3 Encounters:  06/07/21 131 lb 4 oz (59.5 kg)  03/05/21 128 lb (58.1 kg)  10/03/20 127 lb (57.6 kg)     There is no immunization history on file for this patient.  Diabetic Foot Exam - Simple   No data filed     No results found for: "TSH" Lab Results  Component Value Date   WBC 13.5 (H) 06/07/2021   HGB 12.4 06/07/2021   HCT 37.9 06/07/2021   MCV 94 06/07/2021   PLT 237 06/07/2021   Lab Results  Component  Value Date   NA 143 06/07/2021   K 3.6 06/07/2021   CO2 22 06/07/2021   GLUCOSE 78 06/07/2021   BUN 8 06/07/2021   CREATININE 0.71 06/07/2021   BILITOT 0.5  06/07/2021   ALKPHOS 89 06/07/2021   AST 18 06/07/2021   ALT 11 06/07/2021   PROT 7.1 06/07/2021   ALBUMIN 4.2 06/07/2021   CALCIUM 9.1 06/07/2021   ANIONGAP 9 10/01/2020   EGFR 112 06/07/2021   No results found for: "CHOL" No results found for: "HDL" No results found for: "LDLCALC" No results found for: "TRIG" No results found for: "CHOLHDL" Lab Results  Component Value Date   HGBA1C 5.2 11/21/2019       Assessment & Plan:   Problem List Items Addressed This Visit   None Visit Diagnoses     Chest tightness    -  Primary   Relevant Orders   EKG 12-Lead, reassuring with NSR   DG Chest 2 View   CBC with Differential/Platelet (Completed)   Comprehensive metabolic panel Discussed possible causes   Uses Depo-Provera as primary birth control method       Relevant Medications   medroxyPROGESTERone (DEPO-PROVERA) injection 150 mg (Completed)   Other Relevant Orders   POCT urine pregnancy (Completed)   Shortness of breath       Relevant Orders   EKG 12-Lead   DG Chest 2 View   CBC with Differential/Platelet (Completed)   Comprehensive metabolic panel Given anticipatory guidance Additional treatment plan pending study results Reassuring PE   Follow up in 3 mths for depo injection, sooner as needed     I am having Roman L. Tamas maintain her medroxyPROGESTERone and doxycycline. We administered medroxyPROGESTERone.  Meds ordered this encounter  Medications   medroxyPROGESTERone (DEPO-PROVERA) injection 150 mg     Teena Dunk, NP

## 2021-06-08 LAB — COMPREHENSIVE METABOLIC PANEL
ALT: 11 IU/L (ref 0–32)
AST: 18 IU/L (ref 0–40)
Albumin/Globulin Ratio: 1.4 (ref 1.2–2.2)
Albumin: 4.2 g/dL (ref 3.8–4.8)
Alkaline Phosphatase: 89 IU/L (ref 44–121)
BUN/Creatinine Ratio: 11 (ref 9–23)
BUN: 8 mg/dL (ref 6–20)
Bilirubin Total: 0.5 mg/dL (ref 0.0–1.2)
CO2: 22 mmol/L (ref 20–29)
Calcium: 9.1 mg/dL (ref 8.7–10.2)
Chloride: 108 mmol/L — ABNORMAL HIGH (ref 96–106)
Creatinine, Ser: 0.71 mg/dL (ref 0.57–1.00)
Globulin, Total: 2.9 g/dL (ref 1.5–4.5)
Glucose: 78 mg/dL (ref 70–99)
Potassium: 3.6 mmol/L (ref 3.5–5.2)
Sodium: 143 mmol/L (ref 134–144)
Total Protein: 7.1 g/dL (ref 6.0–8.5)
eGFR: 112 mL/min/{1.73_m2} (ref >59)

## 2021-06-08 LAB — CBC WITH DIFFERENTIAL/PLATELET
Basophils Absolute: 0.1 10*3/uL (ref 0.0–0.2)
Basos: 0 %
EOS (ABSOLUTE): 0.3 10*3/uL (ref 0.0–0.4)
Eos: 2 %
Hematocrit: 37.9 % (ref 34.0–46.6)
Hemoglobin: 12.4 g/dL (ref 11.1–15.9)
Immature Grans (Abs): 0 10*3/uL (ref 0.0–0.1)
Immature Granulocytes: 0 %
Lymphocytes Absolute: 3.2 10*3/uL — ABNORMAL HIGH (ref 0.7–3.1)
Lymphs: 23 %
MCH: 30.6 pg (ref 26.6–33.0)
MCHC: 32.7 g/dL (ref 31.5–35.7)
MCV: 94 fL (ref 79–97)
Monocytes Absolute: 0.6 10*3/uL (ref 0.1–0.9)
Monocytes: 4 %
Neutrophils Absolute: 9.5 10*3/uL — ABNORMAL HIGH (ref 1.4–7.0)
Neutrophils: 71 %
Platelets: 237 10*3/uL (ref 150–450)
RBC: 4.05 x10E6/uL (ref 3.77–5.28)
RDW: 12.1 % (ref 11.7–15.4)
WBC: 13.5 10*3/uL — ABNORMAL HIGH (ref 3.4–10.8)

## 2021-09-08 ENCOUNTER — Ambulatory Visit (INDEPENDENT_AMBULATORY_CARE_PROVIDER_SITE_OTHER): Payer: Medicaid Other | Admitting: Nurse Practitioner

## 2021-09-08 ENCOUNTER — Encounter: Payer: Self-pay | Admitting: Nurse Practitioner

## 2021-09-08 VITALS — BP 106/88 | HR 73 | Temp 98.1°F | Ht 62.5 in | Wt 132.2 lb

## 2021-09-08 DIAGNOSIS — Z789 Other specified health status: Secondary | ICD-10-CM | POA: Diagnosis not present

## 2021-09-08 LAB — POCT URINE PREGNANCY: Preg Test, Ur: NEGATIVE

## 2021-09-08 MED ORDER — MEDROXYPROGESTERONE ACETATE 150 MG/ML IM SUSP
150.0000 mg | Freq: Once | INTRAMUSCULAR | Status: AC
  Administered 2021-09-08: 150 mg via INTRAMUSCULAR

## 2021-09-08 NOTE — Progress Notes (Signed)
@Patient  ID: , female    DOB: [birth year], 36 y.o.   MRN: [id number]  Chief Complaint  Patient presents with   Follow-up    Pt is here for Depo shot    Referring provider: No ref. provider found   HPI  Katie Glover 36 y.o. 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 To the [facility] for depo injection   Patient presents for a routine follow up and depo injection. Patient states that overall she has been doing well. No new issues or concerns today. Will check pregnancy test. Denies f/c/s, n/v/d, hemoptysis, PND, leg swelling Denies chest pain or edema       No Known Allergies   There is no immunization history on file for this patient.  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   doxycycline (VIBRAMYCIN) 100 MG capsule Take 1 capsule (100 mg total) by mouth 2 (two) times daily. (Patient not taking: Reported on 06/07/2021)   [EXPIRED] medroxyPROGESTERone (DEPO-PROVERA) injection 150 mg    No facility-administered encounter medications on file as of 09/08/2021.     Review of Systems  Review of Systems  Constitutional: Negative.   HENT: Negative.    Cardiovascular: Negative.   Gastrointestinal: Negative.   Allergic/Immunologic: Negative.   Neurological: Negative.   Psychiatric/Behavioral: Negative.         Physical Exam  BP 106/88 (BP Location: Left Arm, Patient Position: Sitting, Cuff Size: Normal)   Pulse 73   Temp 98.1 F (36.7 C)   Ht 5' 2.5" (1.588 m)    Wt 132 lb 3.2 oz (60 kg)   SpO2 100%   BMI 23.79 kg/m   Wt Readings from Last 5 Encounters:  09/08/21 132 lb 3.2 oz (60 kg)  06/07/21 131 lb 4 oz (59.5 kg)  03/05/21 128 lb (58.1 kg)  10/03/20 127 lb (57.6 kg)  10/01/20 128 lb (58.1 kg)     Physical Exam Vitals and nursing note reviewed.  Constitutional:      General: She is not in acute distress.    Appearance: She is well-developed.  Cardiovascular:     Rate and Rhythm: Normal rate and regular rhythm.  Pulmonary:     Effort: Pulmonary effort is normal.     Breath sounds: Normal breath sounds.  Neurological:     Mental Status: She is alert and oriented to person, place, and time.      Lab Results:  CBC    Component Value Date/Time   WBC 13.5 (H) 06/07/2021 1054   WBC 9.6 10/01/2020 1834   RBC 4.05 06/07/2021 1054   RBC 4.31 10/01/2020 1834   HGB 12.4 06/07/2021 1054   HCT 37.9 06/07/2021 1054   PLT 237 06/07/2021 1054   MCV 94 06/07/2021 1054   MCH 30.6 06/07/2021 1054   MCH 30.6 10/01/2020 1834   MCHC 32.7 06/07/2021 1054   MCHC 34.0 10/01/2020 1834   RDW 12.1 06/07/2021 1054   LYMPHSABS 3.2 (H) 06/07/2021  1054   MONOABS 0.4 10/01/2020 1834   EOSABS 0.3 06/07/2021 1054   BASOSABS 0.1 06/07/2021 1054    BMET    Component Value Date/Time   NA 143 06/07/2021 1054   K 3.6 06/07/2021 1054   CL 108 (H) 06/07/2021 1054   CO2 22 06/07/2021 1054   GLUCOSE 78 06/07/2021 1054   GLUCOSE 95 10/01/2020 1834   BUN 8 06/07/2021 1054   CREATININE 0.71 06/07/2021 1054   CALCIUM 9.1 06/07/2021 1054   GFRNONAA >60 10/01/2020 1834   GFRAA 134 12/05/2019 1414     Assessment & Plan:   Uses Depo-Provera as primary birth control method - medroxyPROGESTERone (DEPO-PROVERA) injection 150 mg - POCT urine pregnancy   Follow up:  Follow up in 3 months or sooner if needed     Ivonne Andrew, NP 09/08/2021

## 2021-09-08 NOTE — Patient Instructions (Addendum)
1. Uses Depo-Provera as primary birth control method  - medroxyPROGESTERone (DEPO-PROVERA) injection 150 mg - POCT urine pregnancy   Follow up:  Follow up in 3 months or sooner if needed

## 2021-09-08 NOTE — Assessment & Plan Note (Signed)
-   medroxyPROGESTERone (DEPO-PROVERA) injection 150 mg - POCT urine pregnancy   Follow up:  Follow up in 3 months or sooner if needed

## 2021-09-25 IMAGING — US US RENAL
1 series · 14 of 25 positions shown · non-contrast
Comparison: None.

CLINICAL DATA: Pyelonephritis

EXAM:
RENAL / URINARY TRACT ULTRASOUND COMPLETE

[Series 1: us renal · 14 of 36 slices shown]
[im 1/36]
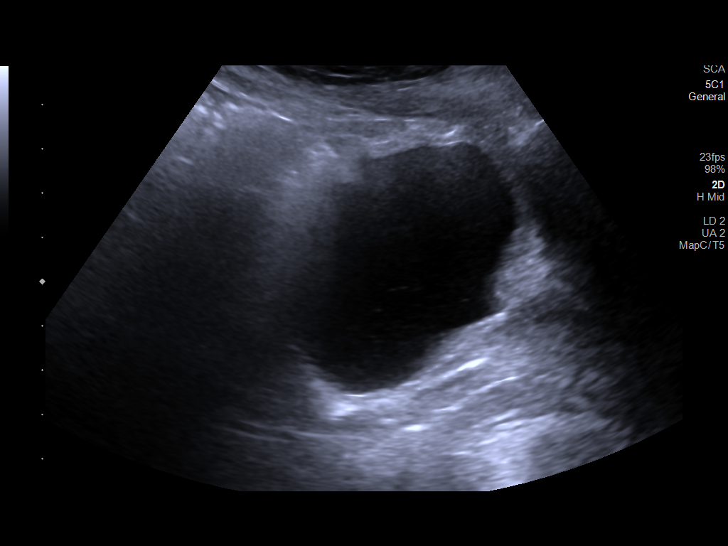
[im 3/36]
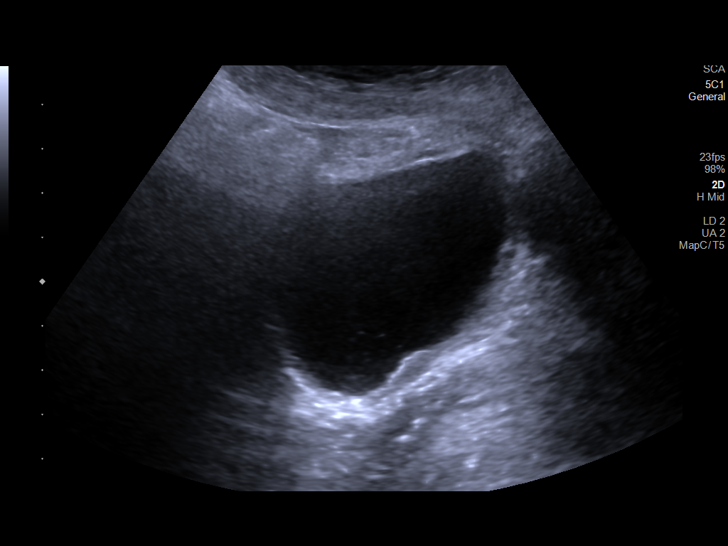
[im 6/36]
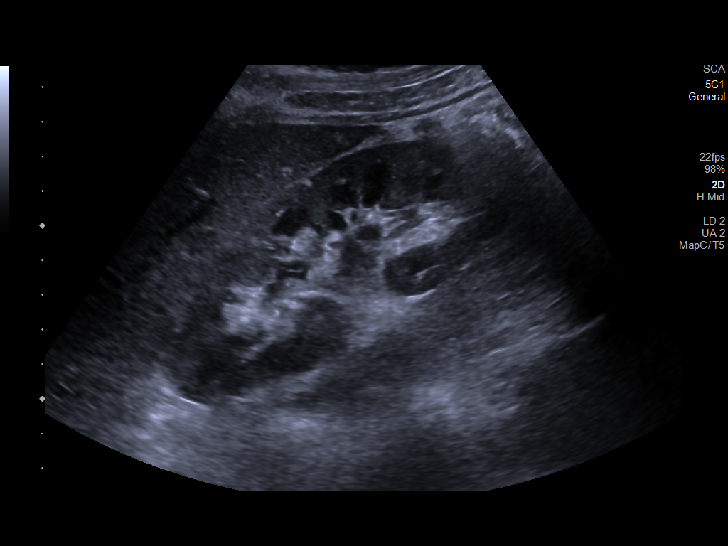
[im 9/36]
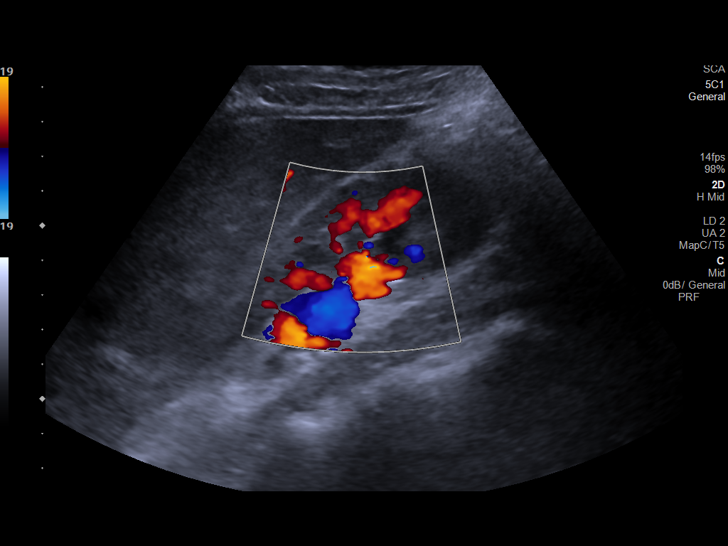
[im 12/36]
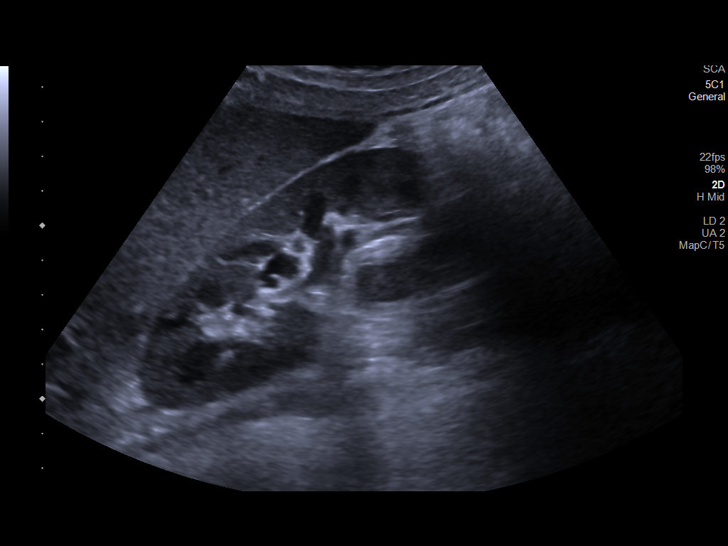
[im 14/36]
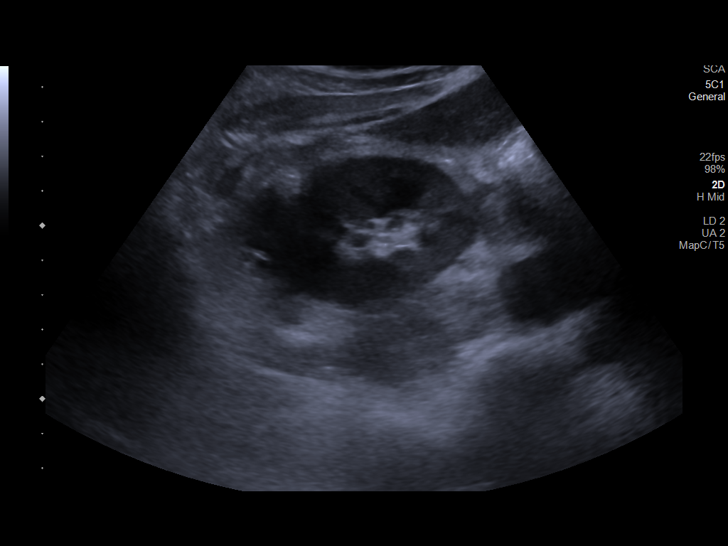
[im 17/36]
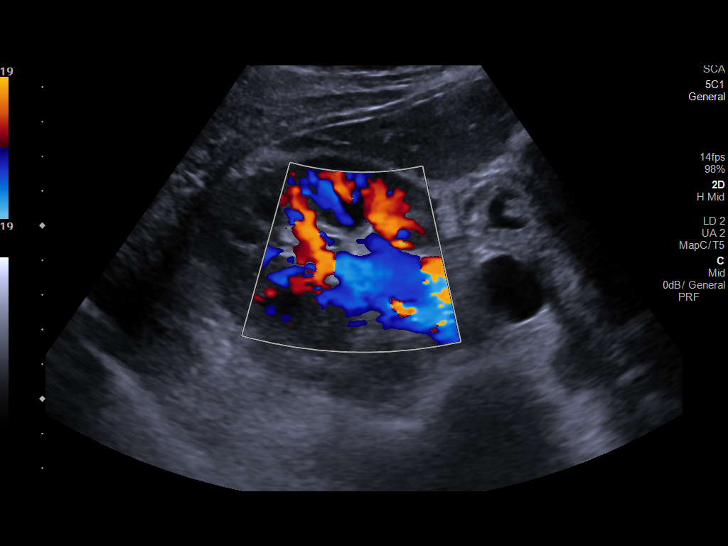
[im 19/36]
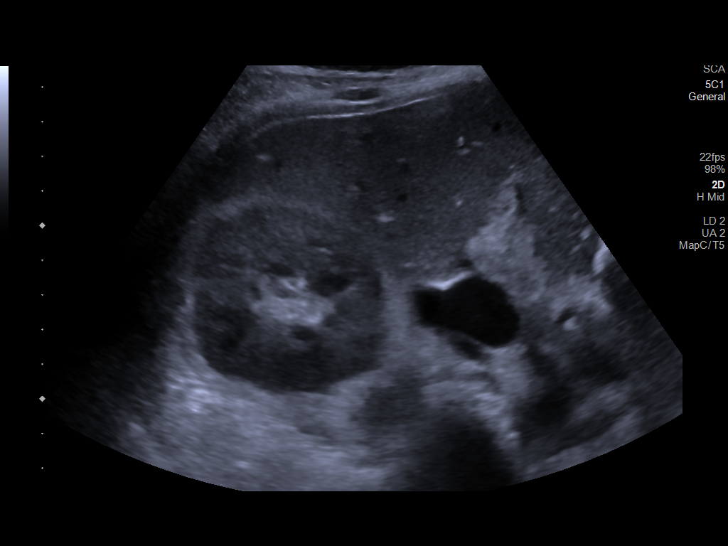
[im 22/36]
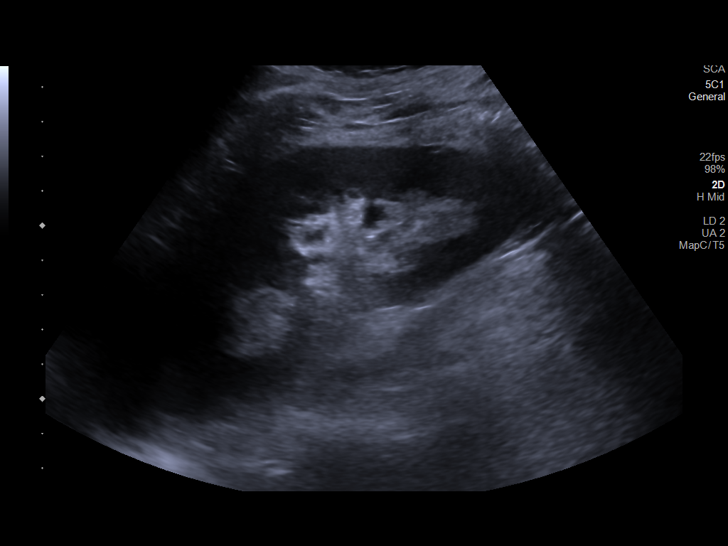
[im 24/36]
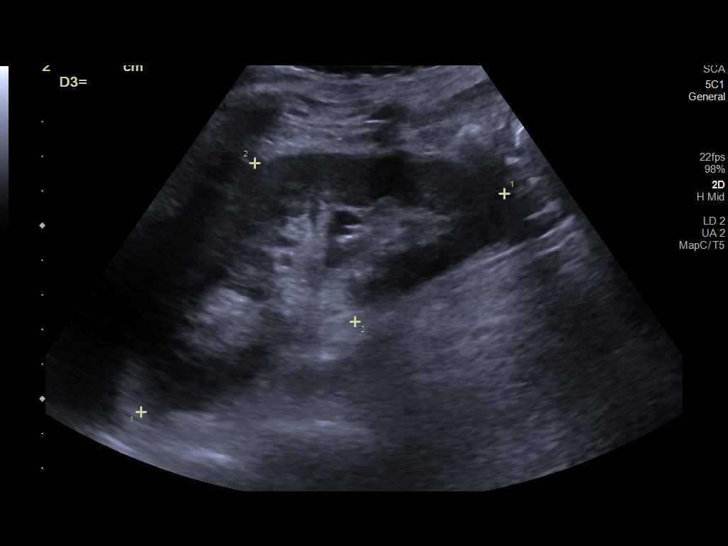
[im 27/36]
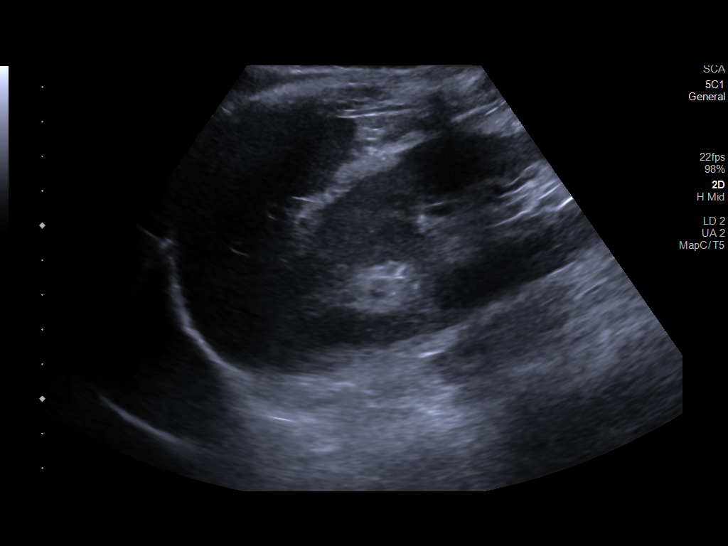
[im 30/36]
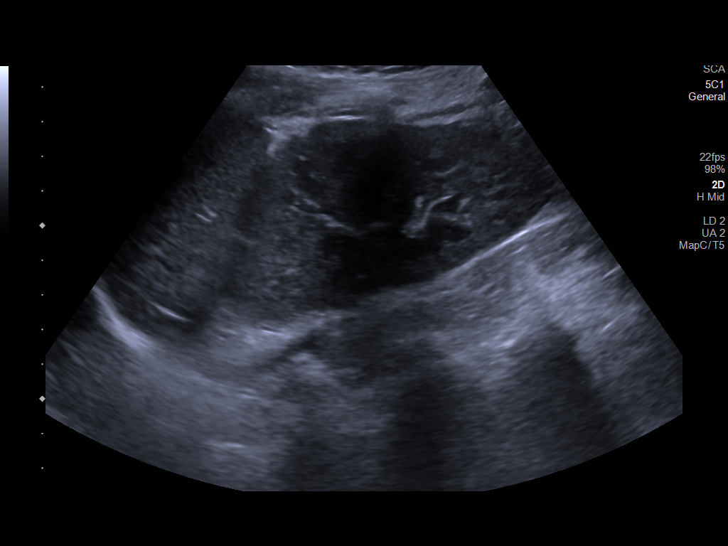
[im 33/36]
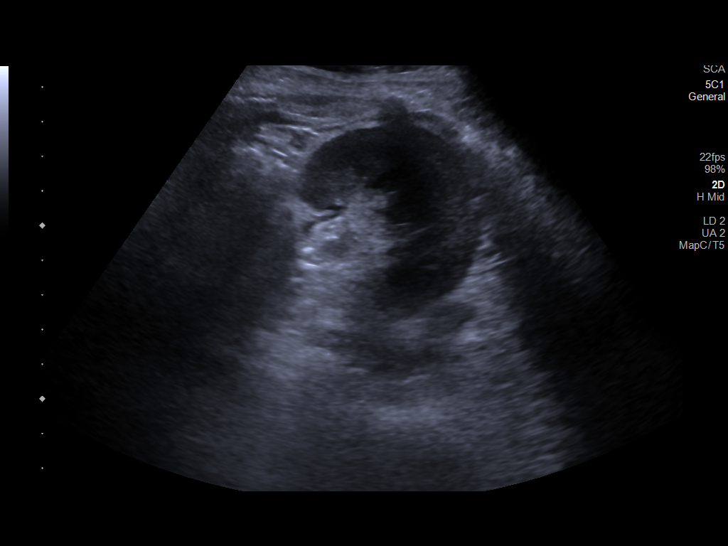
[im 36/36]
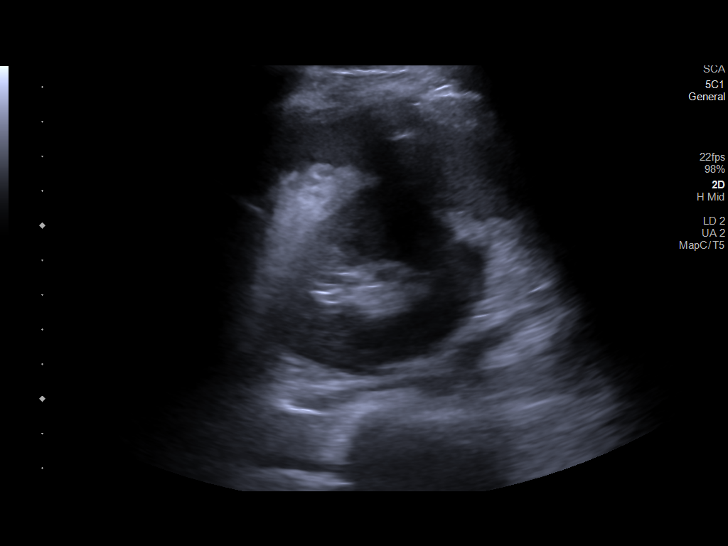

[14 of 25 positions shown; findings below may reference images not displayed]

FINDINGS: Right Kidney:

Renal measurements: 12.4 x 4.2 x 6.6 cm = volume: 181 mL.
Echogenicity within normal limits. No mass or hydronephrosis
visualized.

Left Kidney:

Renal measurements: 12.2 x 5.4 x 5.7 cm = volume: 197 mL.
Echogenicity within normal limits. No mass or hydronephrosis
visualized.

Bladder:

Echogenic debris within the urinary bladder.

Other:

None.
IMPRESSION: 1. Normal ultrasound appearance of the kidneys.  No hydronephrosis.
2. Echogenic debris within the urinary bladder. Correlate with
urinalysis.

## 2021-12-08 ENCOUNTER — Ambulatory Visit (INDEPENDENT_AMBULATORY_CARE_PROVIDER_SITE_OTHER): Payer: Medicaid Other | Admitting: Nurse Practitioner

## 2021-12-08 ENCOUNTER — Ambulatory Visit: Payer: Medicaid Other

## 2021-12-08 ENCOUNTER — Encounter: Payer: Self-pay | Admitting: Nurse Practitioner

## 2021-12-08 VITALS — BP 106/77 | HR 82 | Temp 97.9°F | Ht 62.0 in | Wt 128.0 lb

## 2021-12-08 DIAGNOSIS — Z716 Tobacco abuse counseling: Secondary | ICD-10-CM | POA: Diagnosis not present

## 2021-12-08 DIAGNOSIS — J019 Acute sinusitis, unspecified: Secondary | ICD-10-CM

## 2021-12-08 DIAGNOSIS — B9689 Other specified bacterial agents as the cause of diseases classified elsewhere: Secondary | ICD-10-CM | POA: Diagnosis not present

## 2021-12-08 MED ORDER — FLUTICASONE PROPIONATE 50 MCG/ACT NA SUSP: 2.0000 | Freq: Every day | NASAL | 6 refills | Status: AC

## 2021-12-08 MED ORDER — SALINE SPRAY 0.65 % NA SOLN
1.0000 | NASAL | 0 refills | Status: AC | PRN
Start: 2021-12-08 — End: ?

## 2021-12-08 MED ORDER — AMOXICILLIN-POT CLAVULANATE 875-125 MG PO TABS: 1.0000 | ORAL_TABLET | Freq: Two times a day (BID) | ORAL | 0 refills | Status: AC

## 2021-12-08 NOTE — Progress Notes (Signed)
Established Patient Office Visit  Subjective:  Patient ID: Katie Glover, female    DOB: 27-Feb-1985  Age: 36 y.o. MRN: [id number]  CC:  Chief Complaint  Patient presents with   Sinus Problem    For three weeks negative for covid    HPI Katie Glover is a 36 y.o. female with past medical history of acute cholecystitis daily, pyelonephritis presents with complaints of watery eyes, itchy throat sneezing coughing with greenish colored sputum, sinus pressure, congestion, intermittent headache for the past 3 weeks.  Stated that she has tried taking over-the-counter allergy medications without relief.  COVID test negative 3 times .  She currently denies fever, shortness of breath, malaise, bloody sputum.  Smokes half pack of cigarettes daily started smoking as a teenager.        Past Medical History:  Diagnosis Date   Narcotic abuse in remission [facility])     Past Surgical History:  Procedure Laterality Date   CHOLECYSTECTOMY  11/19/2010   Procedure: LAPAROSCOPIC CHOLECYSTECTOMY;  Surgeon: Harl Bowie, MD;  Location: [facility] OR;  Service: General;  Laterality: N/A;   TUBAL LIGATION      Family History  Problem Relation Age of Onset   Kidney cancer Other    Lung cancer Other    Healthy Mother    Healthy Father     Social History   Socioeconomic History   Marital status: Single    Spouse name: Not on file   Number of children: Not on file   Years of education: Not on file   Highest education level: Not on file  Occupational History   Not on file  Tobacco Use   Smoking status: Every Day    Packs/day: 0.50    Years: 9.00    Total pack years: 4.50    Types: Cigarettes   Smokeless tobacco: Never  Vaping Use   Vaping Use: Never used  Substance and Sexual Activity   Alcohol use: Yes    Comment: OCCASIONAL   Drug use: Yes    Types: Marijuana    Comment: occ   Sexual activity: Yes    Birth control/protection: Injection, Surgical  Other Topics Concern    Not on file  Social History Narrative   Not on file   Social Determinants of Health   Financial Resource Strain: Not on file  Food Insecurity: Not on file  Transportation Needs: Not on file  Physical Activity: Not on file  Stress: Not on file  Social Connections: Not on file  Intimate Partner Violence: Not on file    Outpatient Medications Prior to Visit  Medication Sig Dispense Refill   medroxyPROGESTERone (DEPO-PROVERA) 150 MG/ML injection Inject 150 mg into the muscle every 3 (three) months.     doxycycline (VIBRAMYCIN) 100 MG capsule Take 1 capsule (100 mg total) by mouth 2 (two) times daily. (Patient not taking: Reported on 06/07/2021) 14 capsule 0   No facility-administered medications prior to visit.    No Known Allergies  ROS Review of Systems  Constitutional:  Negative for activity change, appetite change, chills, diaphoresis, fatigue and fever.  HENT:  Positive for congestion, rhinorrhea, sinus pressure and sneezing.   Eyes:  Positive for itching.  Respiratory:  Positive for cough. Negative for choking, chest tightness, shortness of breath, wheezing and stridor.   Cardiovascular:  Negative for chest pain, palpitations and leg swelling.  Musculoskeletal: Negative.   Neurological:  Negative for dizziness, facial asymmetry, light-headedness and headaches.  Psychiatric/Behavioral:  Negative for  agitation, behavioral problems, confusion and self-injury.       Objective:    Physical Exam Constitutional:      General: She is not in acute distress.    Appearance: Normal appearance. She is not ill-appearing, toxic-appearing or diaphoretic.  HENT:     Nose: Congestion and rhinorrhea present. Rhinorrhea is clear.     Mouth/Throat:     Mouth: Mucous membranes are moist.     Pharynx: Oropharynx is clear. No oropharyngeal exudate or posterior oropharyngeal erythema.  Eyes:     General: No scleral icterus.       Right eye: No discharge.     Extraocular Movements:  Extraocular movements intact.     Conjunctiva/sclera: Conjunctivae normal.  Cardiovascular:     Rate and Rhythm: Normal rate and regular rhythm.     Pulses: Normal pulses.     Heart sounds: Normal heart sounds. No murmur heard.    No friction rub. No gallop.  Pulmonary:     Effort: Pulmonary effort is normal. No respiratory distress.     Breath sounds: Normal breath sounds. No stridor. No wheezing, rhonchi or rales.  Chest:     Chest wall: No tenderness.  Abdominal:     General: There is no distension.     Palpations: Abdomen is soft.     Tenderness: There is no abdominal tenderness. There is no guarding.  Musculoskeletal:        General: No swelling, tenderness, deformity or signs of injury.     Right lower leg: No edema.     Left lower leg: No edema.  Skin:    General: Skin is warm and dry.     Capillary Refill: Capillary refill takes less than 2 seconds.  Neurological:     Mental Status: She is alert and oriented to person, place, and time.     Cranial Nerves: No cranial nerve deficit.     Motor: No weakness.     Gait: Gait normal.  Psychiatric:        Mood and Affect: Mood normal.        Behavior: Behavior normal.        Thought Content: Thought content normal.        Judgment: Judgment normal.     BP 106/77   Pulse 82   Temp 97.9 F (36.6 C)   Ht _0  (1.575 m)   Wt 128 lb (58.1 kg)   SpO2 99%   BMI 23.41 kg/m  Wt Readings from Last 3 Encounters:  12/08/21 128 lb (58.1 kg)  09/08/21 132 lb 3.2 oz (60 kg)  06/07/21 131 lb 4 oz (59.5 kg)    No results found for: "TSH" Lab Results  Component Value Date   WBC 13.5 (H) 06/07/2021   HGB 12.4 06/07/2021   HCT 37.9 06/07/2021   MCV 94 06/07/2021   PLT 237 06/07/2021   Lab Results  Component Value Date   NA 143 06/07/2021   K 3.6 06/07/2021   CO2 22 06/07/2021   GLUCOSE 78 06/07/2021   BUN 8 06/07/2021   CREATININE 0.71 06/07/2021   BILITOT 0.5 06/07/2021   ALKPHOS 89 06/07/2021   AST 18 06/07/2021    ALT 11 06/07/2021   PROT 7.1 06/07/2021   ALBUMIN 4.2 06/07/2021   CALCIUM 9.1 06/07/2021   ANIONGAP 9 10/01/2020   EGFR 112 06/07/2021   No results found for: "CHOL" No results found for: "HDL" No results found for: "LDLCALC" No results found for: "  TRIG" No results found for: "CHOLHDL" Lab Results  Component Value Date   HGBA1C 5.2 11/21/2019      Assessment & Plan:   Problem List Items Addressed This Visit       Respiratory   Acute bacterial rhinosinusitis - Primary     - amoxicillin-clavulanate (AUGMENTIN) 875-125 MG tablet; Take 1 tablet by mouth 2 (two) times daily for 7 days.  Dispense: 14 tablet; Refill: 0 - fluticasone (FLONASE) 50 MCG/ACT nasal spray; Place 2 sprays into both nostrils daily.  Dispense: 16 g; Refill: 6 - sodium chloride (OCEAN) 0.65 % SOLN nasal spray; Place 1 spray into both nostrils as needed for congestion.  Dispense: 30 mL; Refill: 0 Smoking cessation encouraged       Relevant Medications   amoxicillin-clavulanate (AUGMENTIN) 875-125 MG tablet   fluticasone (FLONASE) 50 MCG/ACT nasal spray   sodium chloride (OCEAN) 0.65 % SOLN nasal spray     Other   Tobacco abuse counseling     Smokes about 0.5 pack/day  Asked about quitting: confirms that he/she currently smokes cigarettes Advise to quit smoking: Educated about QUITTING to reduce the risk of cancer, cardio and cerebrovascular disease. Assess willingness: Unwilling to quit at this time, but is working on cutting back. Assist with counseling and pharmacotherapy: Counseled for 5 minutes and literature provided. Arrange for follow up: follow up in 6  months and continue to offer help.        Meds ordered this encounter  Medications   amoxicillin-clavulanate (AUGMENTIN) 875-125 MG tablet    Sig: Take 1 tablet by mouth 2 (two) times daily for 7 days.    Dispense:  14 tablet    Refill:  0   fluticasone (FLONASE) 50 MCG/ACT nasal spray    Sig: Place 2 sprays into both nostrils  daily.    Dispense:  16 g    Refill:  6   sodium chloride (OCEAN) 0.65 % SOLN nasal spray    Sig: Place 1 spray into both nostrils as needed for congestion.    Dispense:  30 mL    Refill:  0    Follow-up: Return in about 6 months (around 06/09/2022) for CPE.    Renee Rival, FNP

## 2021-12-08 NOTE — Patient Instructions (Signed)
1. Acute bacterial rhinosinusitis  - amoxicillin-clavulanate (AUGMENTIN) 875-125 MG tablet; Take 1 tablet by mouth 2 (two) times daily for 7 days.  Dispense: 14 tablet; Refill: 0 - fluticasone (FLONASE) 50 MCG/ACT nasal spray; Place 2 sprays into both nostrils daily.  Dispense: 16 g; Refill: 6 - sodium chloride (OCEAN) 0.65 % SOLN nasal spray; Place 1 spray into both nostrils as needed for congestion.  Dispense: 30 mL; Refill: 0  2. Tobacco abuse counseling   It is important that you exercise regularly at least 30 minutes 5 times a week as tolerated  Think about what you will eat, plan ahead. Choose " clean, green, fresh or frozen" over canned, processed or packaged foods which are more sugary, salty and fatty. 70 to 75% of food eaten should be vegetables and fruit. Three meals at set times with snacks allowed between meals, but they must be fruit or vegetables. Aim to eat over a 12 hour period , example 7 am to 7 pm, and STOP after  your last meal of the day. Drink water,generally about 64 ounces per day, no other drink is as healthy. Fruit juice is best enjoyed in a healthy way, by EATING the fruit.  Thanks for choosing [facility] we consider it a privelige to serve you.

## 2021-12-08 NOTE — Assessment & Plan Note (Signed)
-   amoxicillin-clavulanate (AUGMENTIN) 875-125 MG tablet; Take 1 tablet by mouth 2 (two) times daily for 7 days.  Dispense: 14 tablet; Refill: 0 - fluticasone (FLONASE) 50 MCG/ACT nasal spray; Place 2 sprays into both nostrils daily.  Dispense: 16 g; Refill: 6 - sodium chloride (OCEAN) 0.65 % SOLN nasal spray; Place 1 spray into both nostrils as needed for congestion.  Dispense: 30 mL; Refill: 0 Smoking cessation encouraged

## 2021-12-08 NOTE — Progress Notes (Signed)
Pt was moved to provider schedule to be seen.

## 2021-12-08 NOTE — Assessment & Plan Note (Signed)
Smokes about 0.5 pack/day  Asked about quitting: confirms that he/she currently smokes cigarettes Advise to quit smoking: Educated about QUITTING to reduce the risk of cancer, cardio and cerebrovascular disease. Assess willingness: Unwilling to quit at this time, but is working on cutting back. Assist with counseling and pharmacotherapy: Counseled for 5 minutes and literature provided. Arrange for follow up: follow up in 6 months and continue to offer help. 

## 2021-12-21 ENCOUNTER — Ambulatory Visit (INDEPENDENT_AMBULATORY_CARE_PROVIDER_SITE_OTHER): Payer: Medicaid Other | Admitting: *Deleted

## 2021-12-21 DIAGNOSIS — Z789 Other specified health status: Secondary | ICD-10-CM

## 2021-12-21 MED ORDER — MEDROXYPROGESTERONE ACETATE 150 MG/ML IM SUSP
150.0000 mg | Freq: Once | INTRAMUSCULAR | Status: AC
  Administered 2021-12-21: 150 mg via INTRAMUSCULAR

## 2021-12-21 NOTE — Progress Notes (Signed)
Administered Medroxy progesterone 1 ml right ventrogluteal. Pt tolerated well.

## 2022-02-24 ENCOUNTER — Ambulatory Visit (INDEPENDENT_AMBULATORY_CARE_PROVIDER_SITE_OTHER): Payer: Medicaid Other

## 2022-02-24 ENCOUNTER — Encounter (HOSPITAL_COMMUNITY): Payer: Self-pay | Admitting: *Deleted

## 2022-02-24 ENCOUNTER — Ambulatory Visit (HOSPITAL_COMMUNITY)
Admission: EM | Admit: 2022-02-24 | Discharge: 2022-02-24 | Disposition: A | Discharge status: Home / Self Care | Payer: Medicaid Other | Attending: Internal Medicine | Admitting: Internal Medicine

## 2022-02-24 DIAGNOSIS — S92525A Nondisplaced fracture of medial phalanx of left lesser toe(s), initial encounter for closed fracture: Secondary | ICD-10-CM

## 2022-02-24 DIAGNOSIS — S92502A Displaced unspecified fracture of left lesser toe(s), initial encounter for closed fracture: Secondary | ICD-10-CM

## 2022-02-24 NOTE — ED Provider Notes (Addendum)
[place]    CSN: [id number] Arrival date & time: 02/24/22  1740      History   Chief Complaint Chief Complaint  Patient presents with   Toe Injury    HPI Katie Glover is a 37 y.o. female comes to the urgent care this morning after she lost her balance and fell at home.  She is complaining of pain involving the fourth toe of the left foot.  Pain is of moderate severity and aggravated by bearing weight or walking.  She is unable to fully bear weight.  Pain was not relieved with 600 mg of ibuprofen.  Pain shoot up her foot.  It is associated with bruising of the fourth toe of the left foot. HPI  Past Medical History:  Diagnosis Date   Narcotic abuse in remission [facility])     Patient Active Problem List   Diagnosis Date Noted   Tobacco abuse counseling 12/08/2021   Acute bacterial rhinosinusitis 12/08/2021   Uses Depo-Provera as primary birth control method 09/08/2021   Pyelonephritis 11/19/2019   Hypokalemia 11/19/2019   Acute cholecystitis 11/19/2010    Past Surgical History:  Procedure Laterality Date   CHOLECYSTECTOMY  11/19/2010   Procedure: LAPAROSCOPIC CHOLECYSTECTOMY;  Surgeon: Harl Bowie, MD;  Location: [place];  Service: General;  Laterality: N/A;   TUBAL LIGATION      OB History   No obstetric history on file.      Home Medications    Prior to Admission medications   Medication Sig Start Date End Date Taking? Authorizing Provider  fluticasone (FLONASE) 50 MCG/ACT nasal spray Place 2 sprays into both nostrils daily. 12/08/21   Renee Rival, FNP  medroxyPROGESTERone (DEPO-PROVERA) 150 MG/ML injection Inject 150 mg into the muscle every 3 (three) months.    [provider]  sodium chloride (OCEAN) 0.65 % SOLN nasal spray Place 1 spray into both nostrils as needed for congestion. 12/08/21   PasedaDewaine Conger, FNP    Family History Family History  Problem Relation Age of Onset   Kidney cancer Other    Lung cancer  Other    Healthy Mother    Healthy Father     Social History Social History   Tobacco Use   Smoking status: Every Day    Packs/day: 0.50    Years: 9.00    Total pack years: 4.50    Types: Cigarettes   Smokeless tobacco: Never  Vaping Use   Vaping Use: Never used  Substance Use Topics   Alcohol use: Yes    Comment: OCCASIONAL   Drug use: Yes    Types: Marijuana    Comment: occ     Allergies   Patient has no known allergies.   Review of Systems Review of Systems As per HPI  Physical Exam Triage Vital Signs ED Triage Vitals  Enc Vitals Group     BP 02/24/22 1831 112/78     Pulse Rate 02/24/22 1831 73     Resp 02/24/22 1831 18     Temp 02/24/22 1831 98.3 F (36.8 C)     Temp Source 02/24/22 1831 Oral     SpO2 02/24/22 1831 98 %     Weight --      Height --      Head Circumference --      Peak Flow --      Pain Score 02/24/22 1829 4     Pain Loc --      Pain Edu? --  Excl. in GC? --    No data found.  Updated Vital Signs BP 112/78 (BP Location: Left Arm)   Pulse 73   Temp 98.3 F (36.8 C) (Oral)   Resp 18   SpO2 98%   Visual Acuity Right Eye Distance:   Left Eye Distance:   Bilateral Distance:    Right Eye Near:   Left Eye Near:    Bilateral Near:     Physical Exam Vitals and nursing note reviewed.  Constitutional:      General: She is not in acute distress.    Appearance: She is not ill-appearing.  Skin:    General: Skin is warm.     Findings: Bruising present.     Comments: Bruising of the fourth toe of the left foot.  Range of motion is limited secondary to pain.  Neurological:     Mental Status: She is alert.      UC Treatments / Results  Labs (all labs ordered are listed, but only abnormal results are displayed) Labs Reviewed - No data to display  EKG   Radiology No results found.  Procedures Procedures (including critical care time)  Medications Ordered in UC Medications - No data to display  Initial  Impression / Assessment and Plan / UC Course  I have reviewed the triage vital signs and the nursing notes.  Pertinent labs & imaging results that were available during my care of the patient were reviewed by me and considered in my medical decision making (see chart for details).     1.  Nondisplaced fracture of the left fourth toe: Postop shoe Rest, elevation, icing of the left foot as needed Ibuprofen 600 mg every 6-8 hours as needed for pain Follow-up with orthopedic surgery if pain persist.  Final Clinical Impressions(s) / UC Diagnoses   Final diagnoses:  Closed nondisplaced fracture of middle phalanx of lesser toe of left foot, initial encounter     Discharge Instructions      Rest and elevate the affected painful area.   Apply cold compresses intermittently as needed.   As pain recedes, begin normal activities slowly as tolerated.   Please return to urgent care if you have worsening symptoms.     ED Prescriptions   None    PDMP not reviewed this encounter.   Chase Picket, MD 02/24/22 1918    Chase Picket, MD 03/11/22 2212

## 2022-02-24 NOTE — Discharge Instructions (Signed)
Rest and elevate the affected painful area.   Apply cold compresses intermittently as needed.   As pain recedes, begin normal activities slowly as tolerated.   Please return to urgent care if you have worsening symptoms.

## 2022-02-24 NOTE — ED Triage Notes (Signed)
Pt states she got out of bed this morning and her left leg was asleep, she lost her balance and fell hitting her left 4 digit. Her toe is swollen and bruised. She noticed during the day today that her left ankle is swollen as well. She took IBU 669m at 7:30am

## 2022-03-22 ENCOUNTER — Ambulatory Visit (INDEPENDENT_AMBULATORY_CARE_PROVIDER_SITE_OTHER): Payer: Medicaid Other

## 2022-03-22 DIAGNOSIS — Z3042 Encounter for surveillance of injectable contraceptive: Secondary | ICD-10-CM

## 2022-03-22 MED ORDER — MEDROXYPROGESTERONE ACETATE 150 MG/ML IM SUSY
150.0000 mg | PREFILLED_SYRINGE | INTRAMUSCULAR | Status: AC
Start: 2022-03-22 — End: ?
  Administered 2022-03-22: 150 mg via INTRAMUSCULAR

## 2022-03-22 NOTE — Progress Notes (Signed)
Pt was given depo shot today pt tolerated the procedure well. Pt to return back June the 4 - June 18. [place]

## 2022-06-09 ENCOUNTER — Ambulatory Visit: Payer: Self-pay

## 2022-06-09 ENCOUNTER — Ambulatory Visit (INDEPENDENT_AMBULATORY_CARE_PROVIDER_SITE_OTHER): Payer: Medicaid Other

## 2022-06-09 DIAGNOSIS — Z789 Other specified health status: Secondary | ICD-10-CM | POA: Diagnosis not present

## 2022-06-09 MED ORDER — MEDROXYPROGESTERONE ACETATE 150 MG/ML IM SUSY
150.0000 mg | PREFILLED_SYRINGE | INTRAMUSCULAR | Status: AC
Start: 2022-06-09 — End: 2022-06-09
  Administered 2022-06-09: 150 mg via INTRAMUSCULAR

## 2022-06-09 NOTE — Progress Notes (Signed)
Pt here today for a depo shot pt tolerate the procedure well. Gh

## 2022-08-08 IMAGING — CT CT RENAL STONE PROTOCOL
2 of 4 series · 16 of 46 positions shown, 18 images · non-contrast
Comparison: CT abdomen and pelvis 03/20/2008.

CLINICAL DATA: Left flank pain.

EXAM:
CT ABDOMEN AND PELVIS WITHOUT CONTRAST
TECHNIQUE: Multidetector CT imaging of the abdomen and pelvis was performed
following the standard protocol without IV contrast.

[Series 2: axial st · axial · 0.66mm/px · z∈[-431,-56]mm · 13 of 83 slices shown, 15 images]
[im 4/83  soft-tissue]
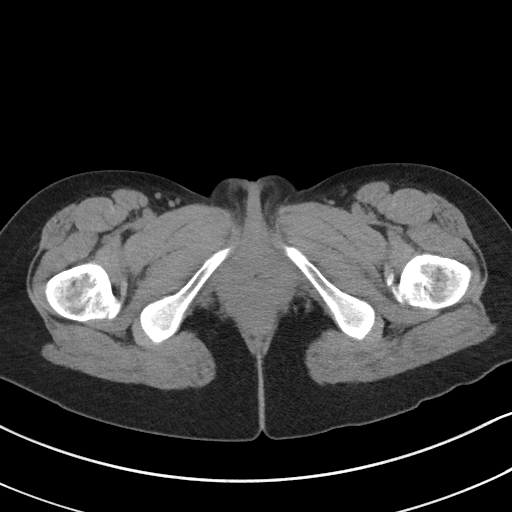
[im 4/83  bone]
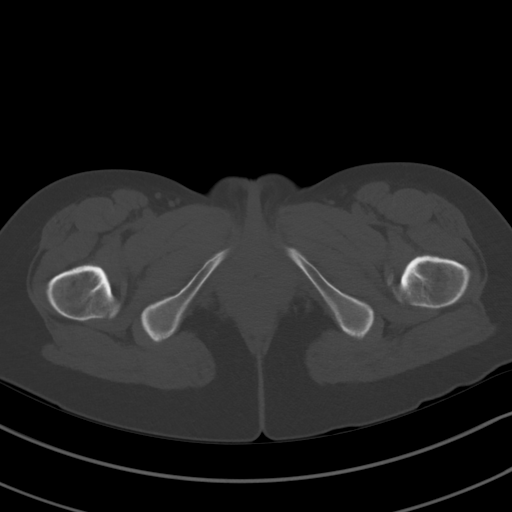
[im 10/83  soft-tissue]
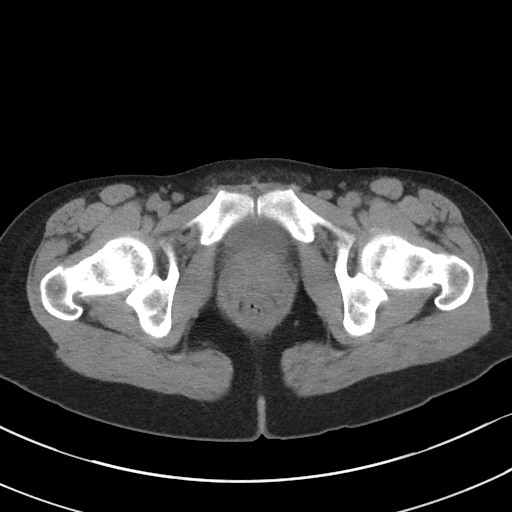
[im 17/83  soft-tissue]
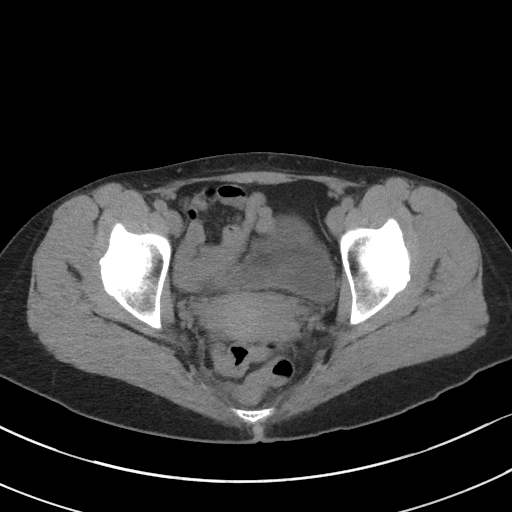
[im 23/83  soft-tissue]
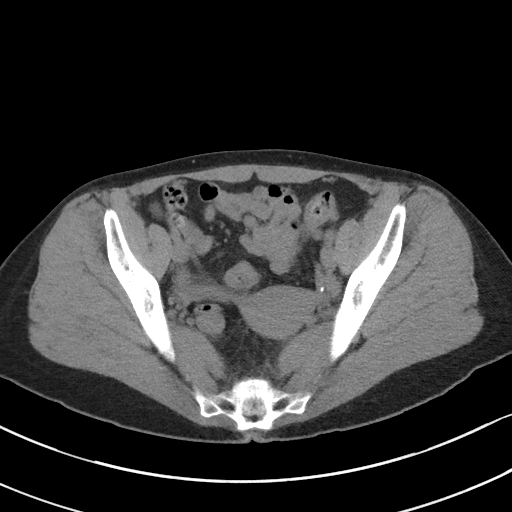
[im 30/83  soft-tissue]
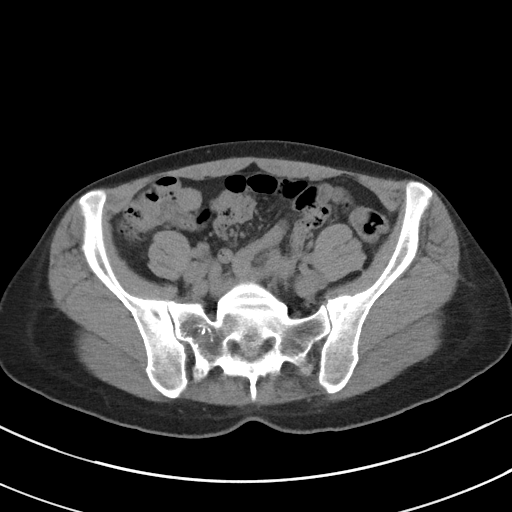
[im 37/83  soft-tissue]
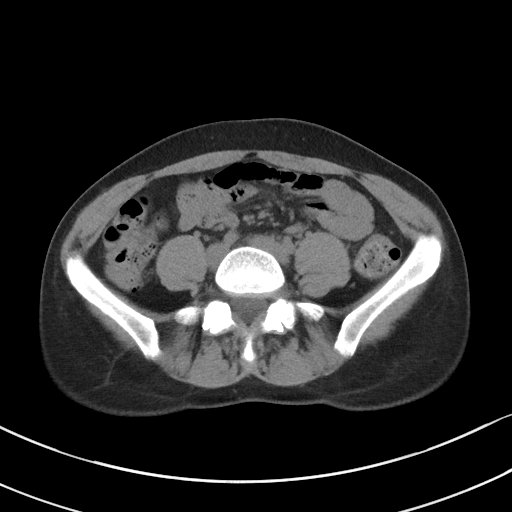
[im 43/83  soft-tissue]
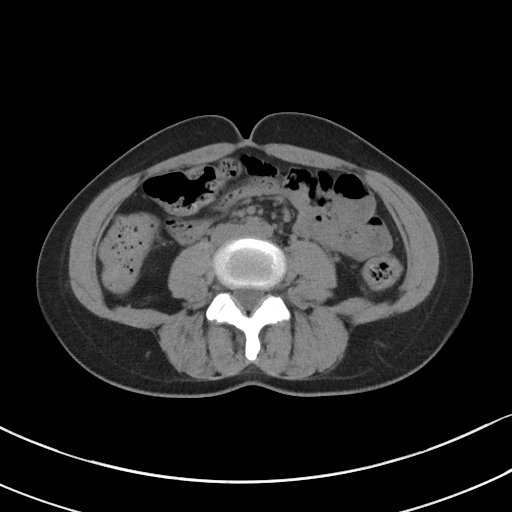
[im 46/83  soft-tissue]
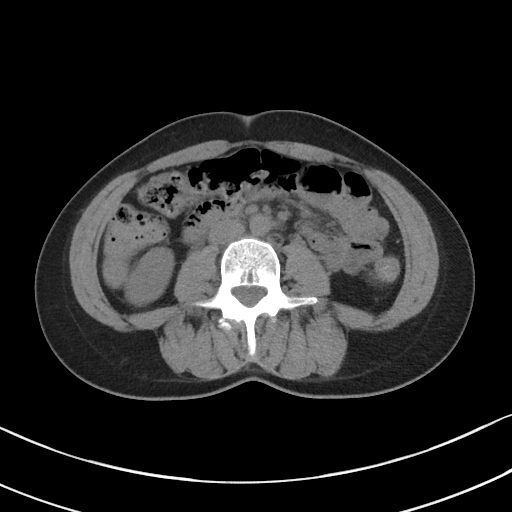
[im 53/83  soft-tissue]
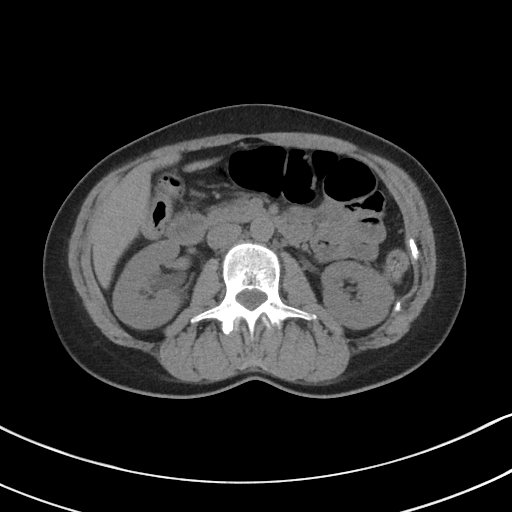
[im 53/83  bone]
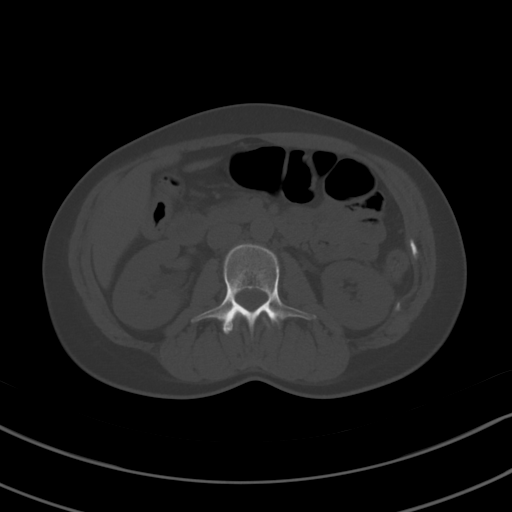
[im 60/83  soft-tissue]
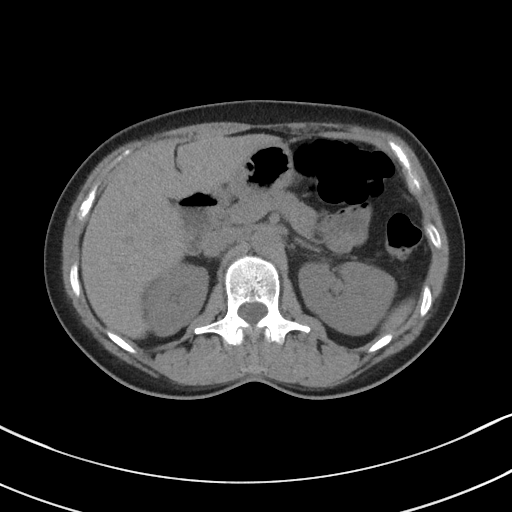
[im 66/83  soft-tissue]
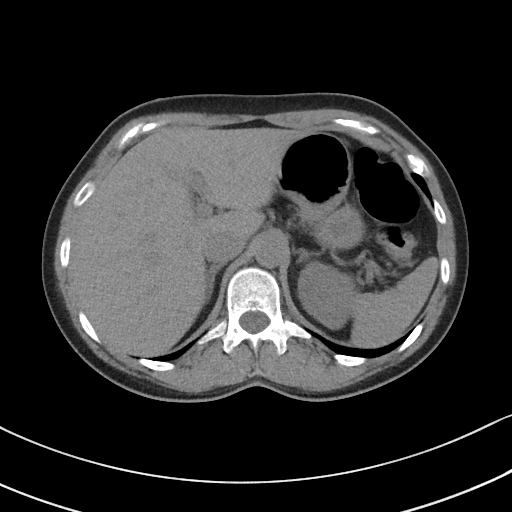
[im 73/83  soft-tissue]
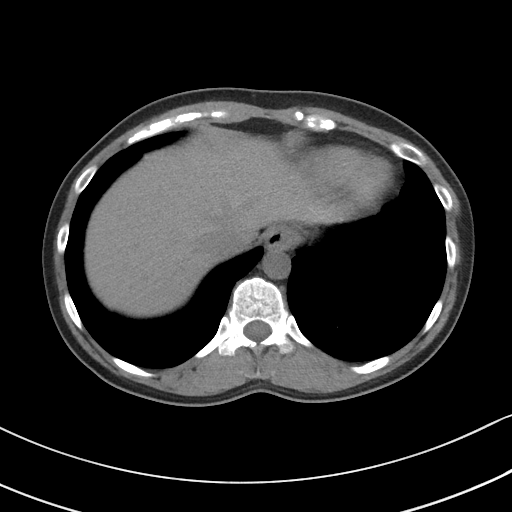
[im 79/83  soft-tissue]
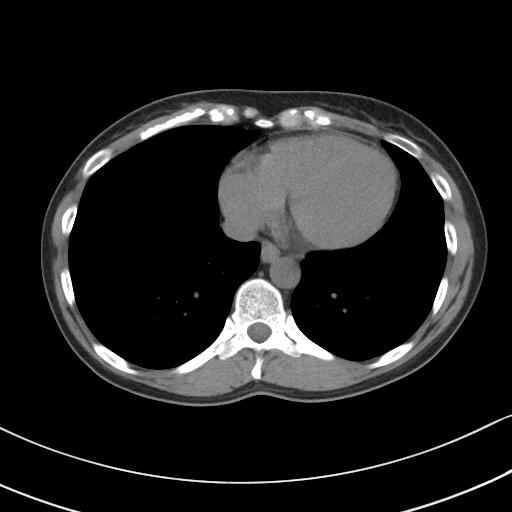

[Series 5: coronal st · coronal · 0.66mm/px · 3 of 101 slices shown]
[im 45/101  soft-tissue]
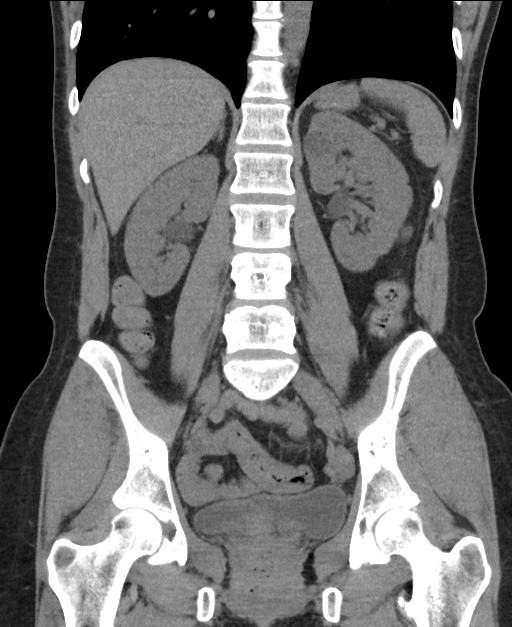
[im 56/101  soft-tissue]
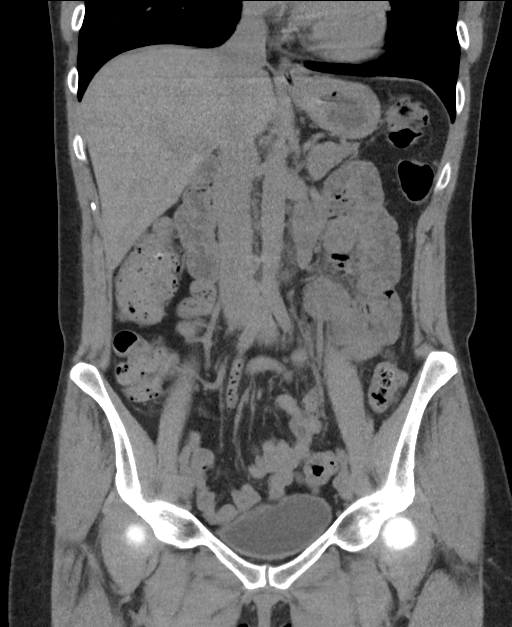
[im 67/101  soft-tissue]
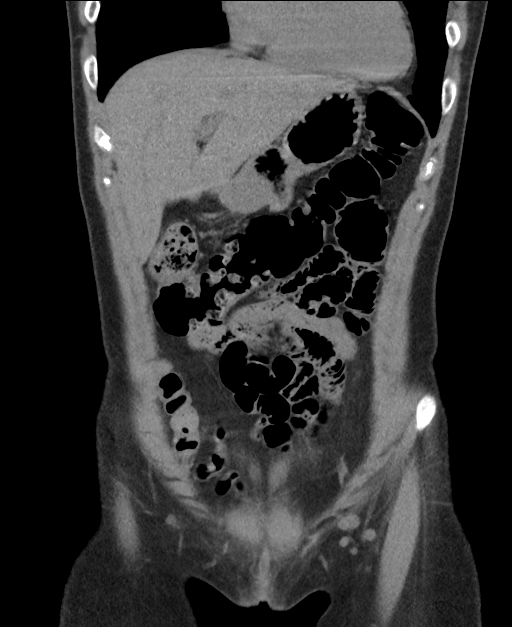

[16 of 46 positions shown; findings below may reference images not displayed]

FINDINGS: Lower chest: No acute abnormality.

Hepatobiliary: No focal liver abnormality is seen. Status post
cholecystectomy. No biliary dilatation.

Pancreas: Unremarkable. No pancreatic ductal dilatation or
surrounding inflammatory changes.

Spleen: Normal in size without focal abnormality.

Adrenals/Urinary Tract: There is a 2 cm cyst in the left kidney. The
kidneys, adrenal glands and bladder are otherwise within normal
limits.

Stomach/Bowel: Stomach is within normal limits. Appendix appears
normal. No evidence of bowel wall thickening, distention, or
inflammatory changes.

Vascular/Lymphatic: No significant vascular findings are present. No
enlarged abdominal or pelvic lymph nodes.

Reproductive: Uterus and bilateral adnexa are unremarkable.

Other: No abdominal wall hernia or abnormality. No abdominopelvic
ascites.

Musculoskeletal: No acute or significant osseous findings.
IMPRESSION: No acute localizing process in the abdomen or pelvis.

2 cm left renal cyst.

## 2022-08-29 ENCOUNTER — Ambulatory Visit (INDEPENDENT_AMBULATORY_CARE_PROVIDER_SITE_OTHER): Payer: Medicaid Other | Admitting: Nurse Practitioner

## 2022-08-29 DIAGNOSIS — Z789 Other specified health status: Secondary | ICD-10-CM

## 2022-08-29 DIAGNOSIS — B379 Candidiasis, unspecified: Secondary | ICD-10-CM

## 2022-08-29 MED ORDER — MEDROXYPROGESTERONE ACETATE 150 MG/ML IM SUSP
150.0000 mg | Freq: Once | INTRAMUSCULAR | Status: AC
  Administered 2022-08-29: 150 mg via INTRAMUSCULAR

## 2022-08-29 NOTE — Progress Notes (Unsigned)
Patient came In for depo Tolerated it well  DOB was confirmed  Next shot is Nov 8 - Dec 6

## 2022-09-01 ENCOUNTER — Other Ambulatory Visit: Payer: Self-pay | Admitting: Nurse Practitioner

## 2022-09-01 DIAGNOSIS — N898 Other specified noninflammatory disorders of vagina: Secondary | ICD-10-CM

## 2022-11-21 ENCOUNTER — Ambulatory Visit (INDEPENDENT_AMBULATORY_CARE_PROVIDER_SITE_OTHER): Payer: Medicaid Other

## 2022-11-21 DIAGNOSIS — Z789 Other specified health status: Secondary | ICD-10-CM

## 2022-11-21 MED ORDER — MEDROXYPROGESTERONE ACETATE 150 MG/ML IM SUSY
150.0000 mg | PREFILLED_SYRINGE | Freq: Once | INTRAMUSCULAR | Status: AC
  Administered 2022-11-21: 150 mg via INTRAMUSCULAR

## 2022-11-21 NOTE — Progress Notes (Signed)
Katie Glover gave Depo injection Right Glut. Pt tolerated well.  Is on schedule and will return in three months .
